# Patient Record
Sex: Male | Born: 1955 | Race: White | Hispanic: No | Smoking: Former smoker
Health system: Southern US, Community
[De-identification: ages and names within clinical notes are randomized; demographics above are authoritative.]

## PROBLEM LIST (undated history)

## (undated) DIAGNOSIS — M1990 Unspecified osteoarthritis, unspecified site: Secondary | ICD-10-CM

## (undated) DIAGNOSIS — F419 Anxiety disorder, unspecified: Secondary | ICD-10-CM

## (undated) DIAGNOSIS — R011 Cardiac murmur, unspecified: Secondary | ICD-10-CM

## (undated) DIAGNOSIS — I2510 Atherosclerotic heart disease of native coronary artery without angina pectoris: Secondary | ICD-10-CM

## (undated) DIAGNOSIS — I350 Nonrheumatic aortic (valve) stenosis: Secondary | ICD-10-CM

## (undated) DIAGNOSIS — F329 Major depressive disorder, single episode, unspecified: Secondary | ICD-10-CM

## (undated) DIAGNOSIS — F32A Depression, unspecified: Secondary | ICD-10-CM

## (undated) DIAGNOSIS — C801 Malignant (primary) neoplasm, unspecified: Secondary | ICD-10-CM

## (undated) HISTORY — DX: Atherosclerotic heart disease of native coronary artery without angina pectoris: I25.10

## (undated) HISTORY — PX: KNEE ARTHROSCOPY: SHX127

## (undated) HISTORY — PX: LEG SURGERY: SHX1003

## (undated) HISTORY — PX: CARDIOVASCULAR STRESS TEST: SHX262

---

## 2016-02-03 ENCOUNTER — Ambulatory Visit (INDEPENDENT_AMBULATORY_CARE_PROVIDER_SITE_OTHER): Payer: Medicare Other

## 2016-02-03 ENCOUNTER — Encounter (INDEPENDENT_AMBULATORY_CARE_PROVIDER_SITE_OTHER): Payer: Self-pay | Admitting: Orthopaedic Surgery

## 2016-02-03 ENCOUNTER — Ambulatory Visit (INDEPENDENT_AMBULATORY_CARE_PROVIDER_SITE_OTHER): Payer: Medicare Other | Admitting: Orthopaedic Surgery

## 2016-02-03 VITALS — BP 135/82 | HR 73 | Resp 14 | Ht 68.5 in | Wt 168.0 lb

## 2016-02-03 DIAGNOSIS — M5442 Lumbago with sciatica, left side: Secondary | ICD-10-CM

## 2016-02-03 DIAGNOSIS — M25552 Pain in left hip: Secondary | ICD-10-CM

## 2016-02-03 DIAGNOSIS — G8929 Other chronic pain: Secondary | ICD-10-CM
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
## 2016-04-20 ENCOUNTER — Ambulatory Visit (INDEPENDENT_AMBULATORY_CARE_PROVIDER_SITE_OTHER): Payer: Medicare Other | Admitting: Orthopedic Surgery

## 2016-04-20 ENCOUNTER — Encounter (HOSPITAL_COMMUNITY): Payer: Self-pay

## 2016-04-20 ENCOUNTER — Encounter (HOSPITAL_COMMUNITY)
Admission: RE | Admit: 2016-04-20 | Discharge: 2016-04-20 | Disposition: A | Discharge status: Home / Self Care | Payer: Medicare Other | Source: Ambulatory Visit | Attending: Orthopaedic Surgery | Admitting: Orthopaedic Surgery

## 2016-04-20 ENCOUNTER — Ambulatory Visit (HOSPITAL_COMMUNITY)
Admission: RE | Admit: 2016-04-20 | Discharge: 2016-04-20 | Disposition: A | Discharge status: Home / Self Care | Payer: Medicare Other | Source: Ambulatory Visit | Attending: Orthopedic Surgery | Admitting: Orthopedic Surgery

## 2016-04-20 ENCOUNTER — Encounter (INDEPENDENT_AMBULATORY_CARE_PROVIDER_SITE_OTHER): Payer: Self-pay | Admitting: Orthopedic Surgery

## 2016-04-20 VITALS — BP 139/81 | HR 77 | Ht 68.5 in | Wt 168.0 lb

## 2016-04-20 DIAGNOSIS — Z01812 Encounter for preprocedural laboratory examination: Secondary | ICD-10-CM | POA: Diagnosis not present

## 2016-04-20 DIAGNOSIS — M1612 Unilateral primary osteoarthritis, left hip: Secondary | ICD-10-CM | POA: Diagnosis not present

## 2016-04-20 DIAGNOSIS — M25552 Pain in left hip: Secondary | ICD-10-CM

## 2016-04-20 DIAGNOSIS — R9431 Abnormal electrocardiogram [ECG] [EKG]: Secondary | ICD-10-CM | POA: Insufficient documentation

## 2016-04-20 DIAGNOSIS — Z01810 Encounter for preprocedural cardiovascular examination: Secondary | ICD-10-CM | POA: Insufficient documentation

## 2016-04-20 DIAGNOSIS — Z01818 Encounter for other preprocedural examination: Secondary | ICD-10-CM

## 2016-04-20 HISTORY — DX: Major depressive disorder, single episode, unspecified: F32.9

## 2016-04-20 HISTORY — DX: Depression, unspecified: F32.A

## 2016-04-20 HISTORY — DX: Cardiac murmur, unspecified: R01.1

## 2016-04-20 HISTORY — DX: Malignant (primary) neoplasm, unspecified: C80.1

## 2016-04-20 HISTORY — DX: Nonrheumatic aortic (valve) stenosis: I35.0

## 2016-04-20 HISTORY — DX: Anxiety disorder, unspecified: F41.9

## 2016-04-20 HISTORY — DX: Unspecified osteoarthritis, unspecified site: M19.90

## 2016-04-20 LAB — COMPREHENSIVE METABOLIC PANEL
ALT: 21 U/L (ref 17–63)
AST: 26 U/L (ref 15–41)
Albumin: 4.3 g/dL (ref 3.5–5.0)
Alkaline Phosphatase: 75 U/L (ref 38–126)
Anion gap: 13 (ref 5–15)
BILIRUBIN TOTAL: 0.8 mg/dL (ref 0.3–1.2)
BUN: 15 mg/dL (ref 6–20)
CHLORIDE: 98 mmol/L — AB (ref 101–111)
CO2: 23 mmol/L (ref 22–32)
CREATININE: 1.38 mg/dL — AB (ref 0.61–1.24)
Calcium: 9.4 mg/dL (ref 8.9–10.3)
GFR calc Af Amer: >60 mL/min (ref >60)
GFR, EST NON AFRICAN AMERICAN: 54 mL/min — AB (ref >60)
Glucose, Bld: 97 mg/dL (ref 65–99)
Potassium: 4.4 mmol/L (ref 3.5–5.1)
Sodium: 134 mmol/L — ABNORMAL LOW (ref 135–145)
Total Protein: 7.7 g/dL (ref 6.5–8.1)

## 2016-04-20 LAB — CBC WITH DIFFERENTIAL/PLATELET
BASOS ABS: 0.1 10*3/uL (ref 0.0–0.1)
Band Neutrophils: 0 %
Basophils Relative: 1 %
Blasts: 0 %
Eosinophils Absolute: 0.6 10*3/uL (ref 0.0–0.7)
Eosinophils Relative: 4 %
HCT: 45.8 % (ref 39.0–52.0)
Hemoglobin: 15.6 g/dL (ref 13.0–17.0)
Lymphocytes Relative: 34 %
Lymphs Abs: 4.8 10*3/uL — ABNORMAL HIGH (ref 0.7–4.0)
MCH: 30.2 pg (ref 26.0–34.0)
MCHC: 34.1 g/dL (ref 30.0–36.0)
MCV: 88.6 fL (ref 78.0–100.0)
METAMYELOCYTES PCT: 0 %
MYELOCYTES: 0 %
Monocytes Absolute: 0.7 10*3/uL (ref 0.1–1.0)
Monocytes Relative: 5 %
Neutro Abs: 7.8 10*3/uL — ABNORMAL HIGH (ref 1.7–7.7)
Neutrophils Relative %: 56 %
Other: 0 %
PLATELETS: 235 10*3/uL (ref 150–400)
PROMYELOCYTES ABS: 0 %
RBC: 5.17 MIL/uL (ref 4.22–5.81)
RDW: 13.9 % (ref 11.5–15.5)
WBC: 14 10*3/uL — AB (ref 4.0–10.5)
nRBC: 0 /100 WBC

## 2016-04-20 LAB — SURGICAL PCR SCREEN
MRSA, PCR: NEGATIVE
Staphylococcus aureus: NEGATIVE

## 2016-04-20 LAB — PROTIME-INR
INR: 1.04
PROTHROMBIN TIME: 13.6 s (ref 11.4–15.2)

## 2016-04-20 LAB — ABO/RH: ABO/RH(D): A NEG

## 2016-04-20 LAB — APTT: APTT: 28 s (ref 24–36)

## 2016-04-20 MED ORDER — HYDROCODONE-ACETAMINOPHEN 10-325 MG PO TABS: 1.0000 | ORAL_TABLET | ORAL | 0 refills | Status: DC | PRN

## 2016-04-20 NOTE — Progress Notes (Deleted)
   Office Visit Note   Patient: Carl Hunt           Date of Birth: 1955/06/08           MRN: [id number] Visit Date: 04/20/2016              Requested by: No referring provider defined for this encounter. PCP: Pcp Not In System   Assessment & Plan: Visit Diagnoses:  1. Pain of left hip joint     Plan: ***  Follow-Up Instructions: No Follow-up on file.   Orders:  No orders of the defined types were placed in this encounter.  No orders of the defined types were placed in this encounter.     Procedures: No procedures performed   Clinical Data: No additional findings.   Subjective: No chief complaint on file.   Pt here today for H & P fofr Left Total hip replacement on 05/04/16.    Review of Systems  Constitutional: Negative.   HENT: Negative.   Eyes: Negative.   Respiratory: Negative.   Cardiovascular: Negative.   Gastrointestinal: Negative.   Endocrine: Negative.   Genitourinary: Negative.   Musculoskeletal: Negative.   Skin: Negative.   Allergic/Immunologic: Negative.   Neurological: Negative.   Hematological: Negative.   Psychiatric/Behavioral: Negative.      Objective: Vital Signs: There were no vitals taken for this visit.  Physical Exam  Ortho Exam  Specialty Comments:  No specialty comments available.  Imaging: No results found.   PMFS History: There are no active problems to display for this patient.  No past medical history on file.  No family history on file.  No past surgical history on file. Social History   Occupational History  . Not on file.   Social History Main Topics  . Smoking status: Light Tobacco Smoker    Types: Cigarettes  . Smokeless tobacco: Never Used  . Alcohol use 0.6 oz/week    1 Cans of beer per week  . Drug use: Unknown  . Sexual activity: Not on file

## 2016-04-20 NOTE — Progress Notes (Signed)
REQUESTED STRESS TEST, ECHO, EKG, OV, FROM [facility].  [phone]

## 2016-04-20 NOTE — Pre-Procedure Instructions (Signed)
Carl Hunt  04/20/2016      [place], [place] - [address] [place] [address] Phone: [phone] Fax: [phone]    Your procedure is scheduled on   Tuesday  05/03/16  Report to [facility] Admitting at 530 A.M.  Call this number if you have problems the morning of surgery:  [phone]   Remember:  Do not eat food or drink liquids after midnight.  Take these medicines the morning of surgery with A SIP OF WATER   ALPRAZOLAM (XANAX), HYDROCODONE IF NEEDED  (STOP 7 DAYS PRIOR TO SURGERY ASPIRIN OR ASPIRIN PRODUCTS, IBUPROFEN/ ADVIL/ MOTRIN, GOODY POWDERS, BC'S, DICLOFENAC/ VOLTAREN, OMEGA 3 FISH OIL, HERBAL MEDICINES)   Do not wear jewelry, make-up or nail polish.  Do not wear lotions, powders, or perfumes, or deoderant.  Do not shave 48 hours prior to surgery.  Men may shave face and neck.  Do not bring valuables to the hospital.  [facility] is not responsible for any belongings or valuables.  Contacts, dentures or bridgework may not be worn into surgery.  Leave your suitcase in the car.  After surgery it may be brought to your room.  For patients admitted to the hospital, discharge time will be determined by your treatment team.  Patients discharged the day of surgery will not be allowed to drive home.   Name and phone number of your driver:     Special instructions:  [name] - Preparing for Surgery  Before surgery, you can play an important role.  Because skin is not sterile, your skin needs to be as free of germs as possible.  You can reduce the number of germs on you skin by washing with CHG (chlorahexidine gluconate) soap before surgery.  CHG is an antiseptic cleaner which kills germs and bonds with the skin to continue killing germs even after washing.  Please DO NOT use if you have an allergy to CHG or antibacterial soaps.  If your skin becomes reddened/irritated stop using the CHG  and inform your nurse when you arrive at Short Stay.  Do not shave (including legs and underarms) for at least 48 hours prior to the first CHG shower.  You may shave your face.  Please follow these instructions carefully:   1.  Shower with CHG Soap the night before surgery and the                                morning of Surgery.  2.  If you choose to wash your hair, wash your hair first as usual with your       normal shampoo.  3.  After you shampoo, rinse your hair and body thoroughly to remove the                      Shampoo.  4.  Use CHG as you would any other liquid soap.  You can apply chg directly       to the skin and wash gently with scrungie or a clean washcloth.  5.  Apply the CHG Soap to your body ONLY FROM THE NECK DOWN.        Do not use on open wounds or open sores.  Avoid contact with your eyes,       ears, mouth and genitals (private parts).  Wash genitals (private parts)  with your normal soap.  6.  Wash thoroughly, paying special attention to the area where your surgery        will be performed.  7.  Thoroughly rinse your body with warm water from the neck down.  8.  DO NOT shower/wash with your normal soap after using and rinsing off       the CHG Soap.  9.  Pat yourself dry with a clean towel.            10.  Wear clean pajamas.            11.  Place clean sheets on your bed the night of your first shower and do not        sleep with pets.  Day of Surgery  Do not apply any lotions/deoderants the morning of surgery.  Please wear clean clothes to the hospital/surgery center.    Please read over the following fact sheets that you were given. Total Joint Packet, MRSA Information and Surgical Site Infection Prevention
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
## 2016-04-21 LAB — TYPE AND SCREEN
ABO/RH(D): A NEG
Antibody Screen: NEGATIVE

## 2016-04-21 LAB — URINE CULTURE
Culture: <10000 — AB
Special Requests: NORMAL

## 2016-04-22 ENCOUNTER — Encounter (HOSPITAL_COMMUNITY): Payer: Self-pay | Admitting: Vascular Surgery

## 2016-04-22 NOTE — Progress Notes (Addendum)
Anesthesia Chart Review: Patient is a 61 year old male scheduled for left THA on 05/03/16 by Dr. Durward Fortes.   History includes smoking, murmur, anxiety, depression, arthritis, skin cancer.   PCP is not listed. Cardiologist is Dr. Orpah Greek in [place], [place].  Meds include Xanax, Norco, magnesium, fish oil, Crestor.  BP 118/73   Pulse 69   Temp 37.1 C   Resp 20   Ht 5\' 8"  (1.727 m)   Wt 168 lb 3.4 oz (76.3 kg)   SpO2 99%   BMI 25.58 kg/m    EKG 04/20/16: NSR, possible inferior infarct (age undetermined), ST/T wave abnormality, consider anterolateral ischemia. Currently, there is no comparison tracing available. He reported stress and echo done in 2017.   Preoperative labs noted. WBC 14.0. Cr 1.38. Glucose 97. PT/PTT WNL. Voice message left with Taren at Dr. Rudene Anda office regarding WBC, Cr results. Urine culture showed less than 10,000 colonies per mL, insignificant growth.  Office notes, stress, echo, and EKG requested from Dr. Sabra Heck with [facility] [phone]), but are still pending. Office said their medical records staff should be back on 04/25/16.  Chart will be left for follow-up.  George Hugh [facility] Short Stay Center/Anesthesiology Phone [phone] 04/22/2016 1:59 PM  Addendum: Cardiology records were faxed on 04/20/16. When I called on 04/22/16, staff said records would be faxed on 04/25/16. I called again on 04/26/16 and was told records would be faxed within 24 hours. I called again today, 04/28/16, and was told they would try to fax records before noon (but still not received after 5 PM). I updated Taren at Dr. Rudene Anda office on 04/27/16 that I was having difficultly getting [facility] to send me records. To my understanding Dr. Durward Fortes has received clearance from patient's pain physician Dr. Damita Dunnings in [place], but does not have any cardiology records available on this patient. Will continue to wait for cardiology  records.  George [facility] Short Stay Center/Anesthesiology Phone [phone] 04/28/2016 5:13 PM  Addendum: I called again for cardiology records this morning. Records have just been received from [facility]. Patient was seen on 1/23/17by Dr. Orpah Greek for evaluation of abnormal EKG (lateral T wave inversions) done at his PCP office. 3/6 systolic murmur was also documented. Stress test showed no evidence of significant reversible ischemia, and medical therapy recommended. Echo showed moderate-severe AS. From records received, it does not appear that patient went back for follow-up--and he verbalized to PAT RN that his last echo was 03/2015, so I do not think he has had a follow-up echo either.   Nuclear stress test 04/16/15: Impression: 1. Normal left ventricular contractility. EF calculated at 51%. 2. No evidence of significant reversible ischemia. 3. Low risk scan. Plan: Medical treatment.  Echo 04/09/15: Impression: 1. LA, LV and RV are all dilated. 2. Mild AI, moderate to severe AS, flow 526.8 cm/sec, max PG 111, mean 69.1, AVA 0.61 cm sq. 3. Mild MR. 4. Mild TR. 5. Grade I diastolic dysfunction.  I did not receive a comparison EKG, only a description the tracing showed inversions in leads V5 and V6 which would be consistent with our EKG (although it also showed inversions in V3-4).   Unfortunately, cardiology records not received until today despite multiple requests. Office said they faxed on 04/20/16 when requested, but we never received plus I called every other work day since then requesting those records. Reviewed with anesthesiologist Dr. Jenita Seashore who agrees that patient will need  repeat echo and cardiology evaluation prior to surgery. [place] notified.  George Hugh [facility] Short Stay Center/Anesthesiology Phone [phone] 05/02/2016 1:45 PM
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
## 2016-05-02 ENCOUNTER — Encounter (HOSPITAL_COMMUNITY): Payer: Self-pay

## 2016-05-02 ENCOUNTER — Telehealth (INDEPENDENT_AMBULATORY_CARE_PROVIDER_SITE_OTHER): Payer: Self-pay | Admitting: Orthopaedic Surgery

## 2016-05-02 MED ORDER — TRANEXAMIC ACID 1000 MG/10ML IV SOLN
2000.0000 mg | INTRAVENOUS | Status: DC
  Filled 2016-05-02: qty 20

## 2016-05-02 NOTE — Telephone Encounter (Signed)
Pt called and stated Dr. Sanjuan Dame nurse said she didn't see any reason pt could not have surgery. But he is having a echocardiogram done on Friday. He stated that the nurse told him that when Dr. Sabra Heck is in the office tomorrow he may say pt doesn't need this echo to be completed and will sign off on clearance form. Pt also requesting refill of pain meds.

## 2016-05-02 NOTE — Telephone Encounter (Signed)
Please advise 

## 2016-05-03 ENCOUNTER — Telehealth (INDEPENDENT_AMBULATORY_CARE_PROVIDER_SITE_OTHER): Payer: Self-pay | Admitting: Orthopaedic Surgery

## 2016-05-03 ENCOUNTER — Inpatient Hospital Stay (HOSPITAL_COMMUNITY): Admission: RE | Admit: 2016-05-03 | Payer: Medicare Other | Source: Ambulatory Visit | Admitting: Orthopaedic Surgery

## 2016-05-03 ENCOUNTER — Encounter (HOSPITAL_COMMUNITY): Admission: RE | Payer: Self-pay | Source: Ambulatory Visit

## 2016-05-03 SURGERY — ARTHROPLASTY, HIP, TOTAL,POSTERIOR APPROACH
Anesthesia: General | Site: Hip | Laterality: Left

## 2016-05-03 NOTE — Telephone Encounter (Signed)
Called pt and discussed that cardiology, Dr. Sanjuan Dame office has scheduled pt for a echo this week and a stress test next week. Pt is upset surgery has been cancelled and that he needs these clearances completed. Advised pt I will call to reschedule surgery as soon as I receive clearance from cardiology.

## 2016-05-03 NOTE — Telephone Encounter (Signed)
Patient is trying to get in touch with [facility] about his surgery with Dr. Durward Fortes. He states he is going to the cardiologist on Friday for an echo and then going back Tuesday for a stress test. He says they will have the results on Wednesday. He is requesting a call back from [place] and would also like to know when he could possibly have the surgery.

## 2016-05-03 NOTE — Telephone Encounter (Signed)
thanks

## 2016-05-11 ENCOUNTER — Telehealth (INDEPENDENT_AMBULATORY_CARE_PROVIDER_SITE_OTHER): Payer: Self-pay

## 2016-05-11 NOTE — Telephone Encounter (Signed)
Returned phone call from pt at [facility] office. Pt has asked for pain meds as PW

## 2016-05-11 NOTE — Telephone Encounter (Signed)
Dr. Durward Fortes called Dr. Damita Dunnings at pts pain management clinic in [place] and release Carl Hunt from his care. PO will not write any more rx for pain. He will undergo a heart cath next week, according to pt.

## 2016-05-18 ENCOUNTER — Inpatient Hospital Stay (INDEPENDENT_AMBULATORY_CARE_PROVIDER_SITE_OTHER): Payer: Medicare Other | Admitting: Orthopaedic Surgery

## 2016-05-25 ENCOUNTER — Telehealth (INDEPENDENT_AMBULATORY_CARE_PROVIDER_SITE_OTHER): Payer: Self-pay | Admitting: Orthopaedic Surgery

## 2016-05-25 HISTORY — PX: CARDIAC CATHETERIZATION: SHX172

## 2016-05-25 NOTE — Telephone Encounter (Signed)
Dr. Thayer Ohm office called and states they are getting patient in for a consult next week and the patient will probably have surgery in April to replace his aortic valve. Just an fyi since he was scheduled for surgery with Dr. Durward Fortes and failed the cardiac clearance.

## 2016-06-02 ENCOUNTER — Institutional Professional Consult (permissible substitution) (INDEPENDENT_AMBULATORY_CARE_PROVIDER_SITE_OTHER): Payer: Medicare Other | Admitting: Cardiothoracic Surgery

## 2016-06-02 VITALS — BP 113/73 | Resp 16 | Ht 68.0 in | Wt 164.0 lb

## 2016-06-02 DIAGNOSIS — G894 Chronic pain syndrome: Secondary | ICD-10-CM | POA: Diagnosis not present

## 2016-06-02 DIAGNOSIS — M1612 Unilateral primary osteoarthritis, left hip: Secondary | ICD-10-CM

## 2016-06-02 DIAGNOSIS — I350 Nonrheumatic aortic (valve) stenosis: Secondary | ICD-10-CM | POA: Diagnosis not present
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
## 2016-06-06 ENCOUNTER — Other Ambulatory Visit: Payer: Self-pay | Admitting: *Deleted

## 2016-06-06 DIAGNOSIS — I350 Nonrheumatic aortic (valve) stenosis: Secondary | ICD-10-CM

## 2016-06-06 DIAGNOSIS — Z01818 Encounter for other preprocedural examination: Secondary | ICD-10-CM

## 2016-06-06 DIAGNOSIS — I712 Thoracic aortic aneurysm, without rupture, unspecified: Secondary | ICD-10-CM

## 2016-06-08 ENCOUNTER — Ambulatory Visit (HOSPITAL_COMMUNITY): Payer: Medicare Other

## 2016-06-08 ENCOUNTER — Ambulatory Visit
Admission: RE | Admit: 2016-06-08 | Discharge: 2016-06-08 | Disposition: A | Discharge status: Home / Self Care | Payer: Medicare Other | Source: Ambulatory Visit | Attending: Cardiothoracic Surgery | Admitting: Cardiothoracic Surgery

## 2016-06-08 DIAGNOSIS — Z01818 Encounter for other preprocedural examination: Secondary | ICD-10-CM

## 2016-06-08 DIAGNOSIS — I712 Thoracic aortic aneurysm, without rupture, unspecified: Secondary | ICD-10-CM

## 2016-06-21 ENCOUNTER — Encounter (HOSPITAL_COMMUNITY)
Admission: RE | Admit: 2016-06-21 | Discharge: 2016-06-21 | Disposition: A | Discharge status: Home / Self Care | Payer: Medicare Other | Source: Ambulatory Visit | Attending: Cardiothoracic Surgery | Admitting: Cardiothoracic Surgery

## 2016-06-21 ENCOUNTER — Encounter (HOSPITAL_COMMUNITY): Payer: Self-pay

## 2016-06-21 ENCOUNTER — Ambulatory Visit (HOSPITAL_COMMUNITY)
Admission: RE | Admit: 2016-06-21 | Discharge: 2016-06-21 | Disposition: A | Discharge status: Home / Self Care | Payer: Medicare Other | Source: Ambulatory Visit | Attending: Cardiothoracic Surgery | Admitting: Cardiothoracic Surgery

## 2016-06-21 ENCOUNTER — Ambulatory Visit (HOSPITAL_BASED_OUTPATIENT_CLINIC_OR_DEPARTMENT_OTHER)
Admission: RE | Admit: 2016-06-21 | Discharge: 2016-06-21 | Disposition: A | Discharge status: Home / Self Care | Payer: Medicare Other | Source: Ambulatory Visit | Attending: Cardiothoracic Surgery | Admitting: Cardiothoracic Surgery

## 2016-06-21 DIAGNOSIS — I350 Nonrheumatic aortic (valve) stenosis: Secondary | ICD-10-CM | POA: Diagnosis not present

## 2016-06-21 DIAGNOSIS — I700 Atherosclerosis of aorta: Secondary | ICD-10-CM | POA: Insufficient documentation

## 2016-06-21 DIAGNOSIS — J449 Chronic obstructive pulmonary disease, unspecified: Secondary | ICD-10-CM

## 2016-06-21 DIAGNOSIS — I6523 Occlusion and stenosis of bilateral carotid arteries: Secondary | ICD-10-CM | POA: Insufficient documentation

## 2016-06-21 LAB — PULMONARY FUNCTION TEST
DL/VA % pred: 63 %
DL/VA: 2.88 ml/min/mmHg/L
DLCO unc % pred: 50 %
DLCO unc: 15.42 ml/min/mmHg
FEF 25-75 Post: 2.56 L/s
FEF 25-75 Pre: 1.52 L/s
FEF2575-%Change-Post: 68 %
FEF2575-%Pred-Post: 89 %
FEF2575-%Pred-Pre: 52 %
FEV1-%Change-Post: 30 %
FEV1-%Pred-Post: 76 %
FEV1-%Pred-Pre: 58 %
FEV1-Post: 2.64 L
FEV1-Pre: 2.02 L
FEV1FVC-%Change-Post: 32 %
FEV1FVC-%Pred-Pre: 78 %
FEV6-%Change-Post: 1 %
FEV6-%Pred-Post: 76 %
FEV6-%Pred-Pre: 75 %
FEV6-Post: 3.33 L
FEV6-Pre: 3.28 L
FEV6FVC-%Change-Post: 0 %
FEV6FVC-%Pred-Post: 105 %
FEV6FVC-%Pred-Pre: 104 %
FVC-%Change-Post: -1 %
FVC-%Pred-Post: 73 %
FVC-%Pred-Pre: 74 %
FVC-Post: 3.33 L
FVC-Pre: 3.37 L
Post FEV1/FVC ratio: 79 %
Post FEV6/FVC ratio: 100 %
Pre FEV1/FVC ratio: 60 %
Pre FEV6/FVC Ratio: 99 %
RV % pred: 244 %
RV: 5.26 L
TLC % pred: 131 %
TLC: 8.77 L

## 2016-06-21 LAB — COMPREHENSIVE METABOLIC PANEL WITH GFR
ALT: 20 U/L (ref 17–63)
AST: 25 U/L (ref 15–41)
Albumin: 3.2 g/dL — ABNORMAL LOW (ref 3.5–5.0)
Alkaline Phosphatase: 67 U/L (ref 38–126)
Anion gap: 11 (ref 5–15)
BUN: 12 mg/dL (ref 6–20)
CO2: 24 mmol/L (ref 22–32)
Calcium: 8.9 mg/dL (ref 8.9–10.3)
Chloride: 101 mmol/L (ref 101–111)
Creatinine, Ser: 1.29 mg/dL — ABNORMAL HIGH (ref 0.61–1.24)
GFR calc Af Amer: >60 mL/min
GFR calc non Af Amer: 59 mL/min — ABNORMAL LOW
Glucose, Bld: 124 mg/dL — ABNORMAL HIGH (ref 65–99)
Potassium: 3.8 mmol/L (ref 3.5–5.1)
Sodium: 136 mmol/L (ref 135–145)
Total Bilirubin: 0.2 mg/dL — ABNORMAL LOW (ref 0.3–1.2)
Total Protein: 6.3 g/dL — ABNORMAL LOW (ref 6.5–8.1)

## 2016-06-21 LAB — SURGICAL PCR SCREEN
MRSA, PCR: NEGATIVE
Staphylococcus aureus: NEGATIVE

## 2016-06-21 LAB — URINALYSIS, ROUTINE W REFLEX MICROSCOPIC
Bilirubin Urine: NEGATIVE
Glucose, UA: NEGATIVE mg/dL
Hgb urine dipstick: NEGATIVE
Ketones, ur: NEGATIVE mg/dL
Leukocytes, UA: NEGATIVE
Nitrite: NEGATIVE
Protein, ur: NEGATIVE mg/dL
Specific Gravity, Urine: 1.009 (ref 1.005–1.030)
pH: 6 (ref 5.0–8.0)

## 2016-06-21 LAB — CBC
HCT: 33.9 % — ABNORMAL LOW (ref 39.0–52.0)
Hemoglobin: 11.4 g/dL — ABNORMAL LOW (ref 13.0–17.0)
MCH: 29.3 pg (ref 26.0–34.0)
MCHC: 33.6 g/dL (ref 30.0–36.0)
MCV: 87.1 fL (ref 78.0–100.0)
Platelets: 221 10*3/uL (ref 150–400)
RBC: 3.89 MIL/uL — ABNORMAL LOW (ref 4.22–5.81)
RDW: 14.2 % (ref 11.5–15.5)
WBC: 7.3 10*3/uL (ref 4.0–10.5)

## 2016-06-21 LAB — BLOOD GAS, ARTERIAL
Acid-Base Excess: 0.5 mmol/L (ref 0.0–2.0)
Bicarbonate: 25.1 mmol/L (ref 20.0–28.0)
Drawn by: 449841
FIO2: 21
O2 Saturation: 95.2 %
Patient temperature: 98.6
pCO2 arterial: 44.5 mmHg (ref 32.0–48.0)
pH, Arterial: 7.37 (ref 7.350–7.450)
pO2, Arterial: 73.7 mmHg — ABNORMAL LOW (ref 83.0–108.0)

## 2016-06-21 LAB — APTT: aPTT: 29 seconds (ref 24–36)

## 2016-06-21 LAB — VAS US DOPPLER PRE CABG
LEFT ECA DIAS: -18 cm/s
LEFT VERTEBRAL DIAS: -14 cm/s
Left CCA dist dias: -21 cm/s
Left CCA dist sys: -89 cm/s
Left CCA prox dias: 25 cm/s
Left CCA prox sys: 85 cm/s
Left ICA dist dias: -43 cm/s
Left ICA dist sys: -91 cm/s
Left ICA prox dias: -41 cm/s
Left ICA prox sys: -98 cm/s
RIGHT ECA DIAS: -19 cm/s
RIGHT VERTEBRAL DIAS: -11 cm/s
Right CCA prox dias: 19 cm/s
Right CCA prox sys: 99 cm/s
Right cca dist sys: -67 cm/s

## 2016-06-21 LAB — PROTIME-INR
INR: 1.03
Prothrombin Time: 13.5 seconds (ref 11.4–15.2)

## 2016-06-21 MED ORDER — ALBUTEROL SULFATE (2.5 MG/3ML) 0.083% IN NEBU
2.5000 mg | INHALATION_SOLUTION | Freq: Once | RESPIRATORY_TRACT | Status: AC
  Administered 2016-06-21: 2.5 mg via RESPIRATORY_TRACT

## 2016-06-21 NOTE — Pre-Procedure Instructions (Signed)
Carl Hunt  06/21/2016      [place], [place] - [address] [place] [address] Phone: [phone] Fax: [phone]    Your procedure is scheduled on April 19  Report to [facility] at [place].M.  Call this number if you have problems the morning of surgery:  [phone]   Remember:  Do not eat food or drink liquids after midnight.   Take these medicines the morning of surgery with A SIP OF WATER alprazolam Duanne Moron),  HYDROcodone-acetaminophen (NORCO),   Take all other medications as prescribed except 7 days prior to surgery STOP taking any Aspirin, Aleve, Naproxen, Ibuprofen, Motrin, Advil, Goody's, BC's, all herbal medications, fish oil, and all vitamins    Do not wear jewelry.  Do not wear lotions, powders, or cologne, or deoderant.  Men may shave face and neck.  Do not bring valuables to the hospital.  [facility] is not responsible for any belongings or valuables.  Contacts, dentures or bridgework may not be worn into surgery.  Leave your suitcase in the car.  After surgery it may be brought to your room.  For patients admitted to the hospital, discharge time will be determined by your treatment team.  Patients discharged the day of surgery will not be allowed to drive home.    Special instructions:   [facility]- Preparing For Surgery  Before surgery, you can play an important role. Because skin is not sterile, your skin needs to be as free of germs as possible. You can reduce the number of germs on your skin by washing with CHG (chlorahexidine gluconate) Soap before surgery.  CHG is an antiseptic cleaner which kills germs and bonds with the skin to continue killing germs even after washing.  Please do not use if you have an allergy to CHG or antibacterial soaps. If your skin becomes reddened/irritated stop using the CHG.  Do not shave (including legs and underarms) for at  least 48 hours prior to first CHG shower. It is OK to shave your face.  Please follow these instructions carefully.   1. Shower the NIGHT BEFORE SURGERY and the MORNING OF SURGERY with CHG.   2. If you chose to wash your hair, wash your hair first as usual with your normal shampoo.  3. After you shampoo, rinse your hair and body thoroughly to remove the shampoo.  4. Use CHG as you would any other liquid soap. You can apply CHG directly to the skin and wash gently with a scrungie or a clean washcloth.   5. Apply the CHG Soap to your body ONLY FROM THE NECK DOWN.  Do not use on open wounds or open sores. Avoid contact with your eyes, ears, mouth and genitals (private parts). Wash genitals (private parts) with your normal soap.  6. Wash thoroughly, paying special attention to the area where your surgery will be performed.  7. Thoroughly rinse your body with warm water from the neck down.  8. DO NOT shower/wash with your normal soap after using and rinsing off the CHG Soap.  9. Pat yourself dry with a CLEAN TOWEL.   10. Wear CLEAN PAJAMAS   11. Place CLEAN SHEETS on your bed the night of your first shower and DO NOT SLEEP WITH PETS.    Day of Surgery: Do not apply any deodorants/lotions. Please wear clean clothes to the hospital/surgery center.      Please read over  the following fact sheets that you were given.

## 2016-06-21 NOTE — Progress Notes (Signed)
PCP - Adonis Housekeeper Cardiologist - Dominica Severin. Miller  Chest x-ray - 06/21/16 EKG - 06/07/16 Stress Test -05/10/16  ECHO - 06/13/16 Cardiac Cath -06/07/16   Sending to anesthesia for review of cardiac testing    Patient denies shortness of breath, fever, cough and chest pain at PAT appointment   Patient verbalized understanding of instructions that was given to them at the PAT appointment. Patient expressed that there were no further questions.  Patient was also instructed that they will need to review over the PAT instructions again at home before the surgery.

## 2016-06-21 NOTE — Progress Notes (Addendum)
VASCULAR LAB PRELIMINARY  PRELIMINARY  PRELIMINARY  PRELIMINARY  Pre-op Cardiac Surgery  Carotid Findings:  Bilateral - 1% to 39% ICA stenosis lower end of scale. Vertebral artery flow is antegrade.  Upper Extremity Right Left  Brachial Pressures 110 Triphasic 112 Triphasic  Radial Waveforms Triphasic Trihasic  Ulnar Waveforms Triphasic Triphasic  Palmar Arch (Allen's Test) Normal Abnormal   Findings:  Right - Doppler waveforms remained normal with both radial and ulnar compressions. Left - Doppler waveforms remained normal with radial compression and reversed with ulnar compression.   Chestina Komatsu, RVS 06/21/2016, 12:31 PM

## 2016-06-22 LAB — HEMOGLOBIN A1C
Hgb A1c MFr Bld: 5.8 % — ABNORMAL HIGH (ref 4.8–5.6)
Mean Plasma Glucose: 120 mg/dL

## 2016-06-22 MED ORDER — TRANEXAMIC ACID (OHS) BOLUS VIA INFUSION
15.0000 mg/kg | INTRAVENOUS | Status: AC
  Administered 2016-06-23: 1192.5 mg via INTRAVENOUS
  Filled 2016-06-22: qty 1193

## 2016-06-22 MED ORDER — VANCOMYCIN HCL 10 G IV SOLR
1250.0000 mg | INTRAVENOUS | Status: AC
  Administered 2016-06-23: 1250 mg via INTRAVENOUS
  Filled 2016-06-22: qty 1250

## 2016-06-22 MED ORDER — DEXTROSE 5 % IV SOLN
1.5000 g | INTRAVENOUS | Status: AC
  Administered 2016-06-23: .75 g via INTRAVENOUS
  Administered 2016-06-23: 1.5 g via INTRAVENOUS
  Filled 2016-06-22: qty 1.5

## 2016-06-22 MED ORDER — SODIUM CHLORIDE 0.9 % IV SOLN
30.0000 ug/min | INTRAVENOUS | Status: AC
  Administered 2016-06-23: 20 ug/min via INTRAVENOUS
  Filled 2016-06-22: qty 2

## 2016-06-22 MED ORDER — EPINEPHRINE PF 1 MG/ML IJ SOLN
0.0000 ug/min | INTRAVENOUS | Status: DC
  Filled 2016-06-22: qty 4

## 2016-06-22 MED ORDER — SODIUM CHLORIDE 0.9 % IV SOLN
INTRAVENOUS | Status: DC
  Filled 2016-06-22: qty 30

## 2016-06-22 MED ORDER — METOPROLOL TARTRATE 12.5 MG HALF TABLET
12.5000 mg | ORAL_TABLET | Freq: Once | ORAL | Status: AC
  Administered 2016-06-23: 12.5 mg via ORAL
  Filled 2016-06-22: qty 1

## 2016-06-22 MED ORDER — TRANEXAMIC ACID 1000 MG/10ML IV SOLN
1.5000 mg/kg/h | INTRAVENOUS | Status: AC
  Administered 2016-06-23: 1.5 mg/kg/h via INTRAVENOUS
  Filled 2016-06-22: qty 25

## 2016-06-22 MED ORDER — PLASMA-LYTE 148 IV SOLN
INTRAVENOUS | Status: DC
  Filled 2016-06-22: qty 2.5

## 2016-06-22 MED ORDER — CEFUROXIME SODIUM 750 MG IJ SOLR
750.0000 mg | INTRAMUSCULAR | Status: DC
  Filled 2016-06-22: qty 750

## 2016-06-22 MED ORDER — MAGNESIUM SULFATE 50 % IJ SOLN
40.0000 meq | INTRAMUSCULAR | Status: DC
  Filled 2016-06-22: qty 10

## 2016-06-22 MED ORDER — SODIUM CHLORIDE 0.9 % IV SOLN
INTRAVENOUS | Status: AC
  Administered 2016-06-23: 1.2 [IU]/h via INTRAVENOUS
  Filled 2016-06-22: qty 2.5

## 2016-06-22 MED ORDER — DEXMEDETOMIDINE HCL IN NACL 400 MCG/100ML IV SOLN
0.1000 ug/kg/h | INTRAVENOUS | Status: AC
  Administered 2016-06-23: .3 ug/kg/h via INTRAVENOUS
  Filled 2016-06-22: qty 100

## 2016-06-22 MED ORDER — NITROGLYCERIN IN D5W 200-5 MCG/ML-% IV SOLN
2.0000 ug/min | INTRAVENOUS | Status: DC
  Filled 2016-06-22: qty 250

## 2016-06-22 MED ORDER — TRANEXAMIC ACID (OHS) PUMP PRIME SOLUTION
2.0000 mg/kg | INTRAVENOUS | Status: DC
  Filled 2016-06-22: qty 1.59

## 2016-06-22 MED ORDER — POTASSIUM CHLORIDE 2 MEQ/ML IV SOLN
80.0000 meq | INTRAVENOUS | Status: DC
  Filled 2016-06-22: qty 40

## 2016-06-22 MED ORDER — DOPAMINE-DEXTROSE 3.2-5 MG/ML-% IV SOLN
0.0000 ug/kg/min | INTRAVENOUS | Status: DC
  Filled 2016-06-22: qty 250
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
## 2016-06-23 ENCOUNTER — Inpatient Hospital Stay (HOSPITAL_COMMUNITY)
Admission: RE | Admit: 2016-06-23 | Discharge: 2016-06-28 | DRG: 220 | Disposition: A | Discharge status: Home / Self Care | Payer: Medicare Other | Source: Ambulatory Visit | Attending: Cardiothoracic Surgery | Admitting: Cardiothoracic Surgery

## 2016-06-23 ENCOUNTER — Inpatient Hospital Stay (HOSPITAL_COMMUNITY): Payer: Medicare Other | Admitting: Anesthesiology

## 2016-06-23 ENCOUNTER — Inpatient Hospital Stay (HOSPITAL_COMMUNITY): Payer: Medicare Other

## 2016-06-23 ENCOUNTER — Inpatient Hospital Stay (HOSPITAL_COMMUNITY): Payer: Medicare Other | Admitting: Vascular Surgery

## 2016-06-23 ENCOUNTER — Encounter (HOSPITAL_COMMUNITY)
Admission: RE | Disposition: A | Discharge status: Home / Self Care | Payer: Self-pay | Source: Ambulatory Visit | Attending: Cardiothoracic Surgery

## 2016-06-23 ENCOUNTER — Encounter (HOSPITAL_COMMUNITY): Payer: Self-pay | Admitting: *Deleted

## 2016-06-23 DIAGNOSIS — I2510 Atherosclerotic heart disease of native coronary artery without angina pectoris: Secondary | ICD-10-CM | POA: Diagnosis present

## 2016-06-23 DIAGNOSIS — D62 Acute posthemorrhagic anemia: Secondary | ICD-10-CM | POA: Diagnosis not present

## 2016-06-23 DIAGNOSIS — D689 Coagulation defect, unspecified: Secondary | ICD-10-CM | POA: Diagnosis not present

## 2016-06-23 DIAGNOSIS — M1612 Unilateral primary osteoarthritis, left hip: Secondary | ICD-10-CM | POA: Diagnosis present

## 2016-06-23 DIAGNOSIS — Z87891 Personal history of nicotine dependence: Secondary | ICD-10-CM

## 2016-06-23 DIAGNOSIS — Z79899 Other long term (current) drug therapy: Secondary | ICD-10-CM | POA: Diagnosis not present

## 2016-06-23 DIAGNOSIS — D696 Thrombocytopenia, unspecified: Secondary | ICD-10-CM | POA: Diagnosis present

## 2016-06-23 DIAGNOSIS — Z791 Long term (current) use of non-steroidal anti-inflammatories (NSAID): Secondary | ICD-10-CM

## 2016-06-23 DIAGNOSIS — Z85828 Personal history of other malignant neoplasm of skin: Secondary | ICD-10-CM

## 2016-06-23 DIAGNOSIS — F419 Anxiety disorder, unspecified: Secondary | ICD-10-CM | POA: Diagnosis present

## 2016-06-23 DIAGNOSIS — E8770 Fluid overload, unspecified: Secondary | ICD-10-CM | POA: Diagnosis present

## 2016-06-23 DIAGNOSIS — I350 Nonrheumatic aortic (valve) stenosis: Secondary | ICD-10-CM

## 2016-06-23 DIAGNOSIS — R58 Hemorrhage, not elsewhere classified: Secondary | ICD-10-CM

## 2016-06-23 DIAGNOSIS — I352 Nonrheumatic aortic (valve) stenosis with insufficiency: Secondary | ICD-10-CM | POA: Diagnosis present

## 2016-06-23 DIAGNOSIS — Z952 Presence of prosthetic heart valve: Secondary | ICD-10-CM

## 2016-06-23 HISTORY — PX: TEE WITHOUT CARDIOVERSION: SHX5443

## 2016-06-23 HISTORY — PX: AORTIC VALVE REPLACEMENT: SHX41

## 2016-06-23 LAB — DIC (DISSEMINATED INTRAVASCULAR COAGULATION)PANEL
D-Dimer, Quant: 0.44 ug/mL-FEU (ref 0.00–0.50)
Fibrinogen: 224 mg/dL (ref 210–475)
INR: 1.38
Platelets: 132 10*3/uL — ABNORMAL LOW (ref 150–400)
Prothrombin Time: 17.1 seconds — ABNORMAL HIGH (ref 11.4–15.2)
Smear Review: NONE SEEN
aPTT: 33 seconds (ref 24–36)

## 2016-06-23 LAB — POCT I-STAT 3, ART BLOOD GAS (G3+)
Acid-Base Excess: 2 mmol/L (ref 0.0–2.0)
Acid-Base Excess: 11 mmol/L — ABNORMAL HIGH (ref 0.0–2.0)
Acid-Base Excess: 2 mmol/L (ref 0.0–2.0)
Acid-Base Excess: 1 mmol/L (ref 0.0–2.0)
Bicarbonate: 28 mmol/L (ref 20.0–28.0)
Bicarbonate: 34.6 mmol/L — ABNORMAL HIGH (ref 20.0–28.0)
Bicarbonate: 26.4 mmol/L (ref 20.0–28.0)
Bicarbonate: 24.7 mmol/L (ref 20.0–28.0)
Bicarbonate: 23.7 mmol/L (ref 20.0–28.0)
O2 Saturation: 100 %
O2 Saturation: 100 %
O2 Saturation: 100 %
O2 Saturation: 94 %
O2 Saturation: 98 %
Patient temperature: 36.6
Patient temperature: 37.1
TCO2: 29 mmol/L (ref 0–100)
TCO2: 36 mmol/L (ref 0–100)
TCO2: 28 mmol/L (ref 0–100)
TCO2: 26 mmol/L (ref 0–100)
TCO2: 25 mmol/L (ref 0–100)
pCO2 arterial: 48.5 mmHg — ABNORMAL HIGH (ref 32.0–48.0)
pCO2 arterial: 40.8 mmHg (ref 32.0–48.0)
pCO2 arterial: 38.1 mmHg (ref 32.0–48.0)
pCO2 arterial: 35.8 mmHg (ref 32.0–48.0)
pCO2 arterial: 34.1 mmHg (ref 32.0–48.0)
pH, Arterial: 7.37 (ref 7.350–7.450)
pH, Arterial: 7.537 — ABNORMAL HIGH (ref 7.350–7.450)
pH, Arterial: 7.448 (ref 7.350–7.450)
pH, Arterial: 7.444 (ref 7.350–7.450)
pH, Arterial: 7.45 (ref 7.350–7.450)
pO2, Arterial: 412 mmHg — ABNORMAL HIGH (ref 83.0–108.0)
pO2, Arterial: 579 mmHg — ABNORMAL HIGH (ref 83.0–108.0)
pO2, Arterial: 246 mmHg — ABNORMAL HIGH (ref 83.0–108.0)
pO2, Arterial: 68 mmHg — ABNORMAL LOW (ref 83.0–108.0)
pO2, Arterial: 95 mmHg (ref 83.0–108.0)

## 2016-06-23 LAB — CBC
HCT: 25.3 % — ABNORMAL LOW (ref 39.0–52.0)
HEMATOCRIT: 23.5 % — AB (ref 39.0–52.0)
HEMOGLOBIN: 8.1 g/dL — AB (ref 13.0–17.0)
Hemoglobin: 8.8 g/dL — ABNORMAL LOW (ref 13.0–17.0)
MCH: 29.7 pg (ref 26.0–34.0)
MCH: 29.6 pg (ref 26.0–34.0)
MCHC: 34.5 g/dL (ref 30.0–36.0)
MCHC: 34.8 g/dL (ref 30.0–36.0)
MCV: 86.1 fL (ref 78.0–100.0)
MCV: 85.2 fL (ref 78.0–100.0)
Platelets: 121 10*3/uL — ABNORMAL LOW (ref 150–400)
Platelets: 122 10*3/uL — ABNORMAL LOW (ref 150–400)
RBC: 2.73 MIL/uL — ABNORMAL LOW (ref 4.22–5.81)
RBC: 2.97 MIL/uL — ABNORMAL LOW (ref 4.22–5.81)
RDW: 14.4 % (ref 11.5–15.5)
RDW: 16.4 % — ABNORMAL HIGH (ref 11.5–15.5)
WBC: 10.1 10*3/uL (ref 4.0–10.5)
WBC: 10.9 10*3/uL — ABNORMAL HIGH (ref 4.0–10.5)

## 2016-06-23 LAB — MAGNESIUM: Magnesium: 3.3 mg/dL — ABNORMAL HIGH (ref 1.7–2.4)

## 2016-06-23 LAB — POCT I-STAT 4, (NA,K, GLUC, HGB,HCT)
Glucose, Bld: 125 mg/dL — ABNORMAL HIGH (ref 65–99)
HCT: 24 % — ABNORMAL LOW (ref 39.0–52.0)
Hemoglobin: 8.2 g/dL — ABNORMAL LOW (ref 13.0–17.0)
Potassium: 3.9 mmol/L (ref 3.5–5.1)
Sodium: 139 mmol/L (ref 135–145)

## 2016-06-23 LAB — POCT I-STAT, CHEM 8
BUN: 10 mg/dL (ref 6–20)
BUN: 10 mg/dL (ref 6–20)
BUN: 8 mg/dL (ref 6–20)
BUN: 9 mg/dL (ref 6–20)
BUN: 10 mg/dL (ref 6–20)
BUN: 11 mg/dL (ref 6–20)
Calcium, Ion: 1.23 mmol/L (ref 1.15–1.40)
Calcium, Ion: 0.89 mmol/L — CL (ref 1.15–1.40)
Calcium, Ion: 1.22 mmol/L (ref 1.15–1.40)
Calcium, Ion: 1.06 mmol/L — ABNORMAL LOW (ref 1.15–1.40)
Calcium, Ion: 1.03 mmol/L — ABNORMAL LOW (ref 1.15–1.40)
Calcium, Ion: 1.08 mmol/L — ABNORMAL LOW (ref 1.15–1.40)
Chloride: 100 mmol/L — ABNORMAL LOW (ref 101–111)
Chloride: 101 mmol/L (ref 101–111)
Chloride: 98 mmol/L — ABNORMAL LOW (ref 101–111)
Chloride: 102 mmol/L (ref 101–111)
Chloride: 99 mmol/L — ABNORMAL LOW (ref 101–111)
Chloride: 104 mmol/L (ref 101–111)
Creatinine, Ser: 1 mg/dL (ref 0.61–1.24)
Creatinine, Ser: 0.6 mg/dL — ABNORMAL LOW (ref 0.61–1.24)
Creatinine, Ser: 1 mg/dL (ref 0.61–1.24)
Creatinine, Ser: 0.9 mg/dL (ref 0.61–1.24)
Creatinine, Ser: 1.1 mg/dL (ref 0.61–1.24)
Creatinine, Ser: 1.1 mg/dL (ref 0.61–1.24)
Glucose, Bld: 119 mg/dL — ABNORMAL HIGH (ref 65–99)
Glucose, Bld: 103 mg/dL — ABNORMAL HIGH (ref 65–99)
Glucose, Bld: 81 mg/dL (ref 65–99)
Glucose, Bld: 126 mg/dL — ABNORMAL HIGH (ref 65–99)
Glucose, Bld: 120 mg/dL — ABNORMAL HIGH (ref 65–99)
Glucose, Bld: 138 mg/dL — ABNORMAL HIGH (ref 65–99)
HCT: 30 % — ABNORMAL LOW (ref 39.0–52.0)
HCT: 21 % — ABNORMAL LOW (ref 39.0–52.0)
HCT: 28 % — ABNORMAL LOW (ref 39.0–52.0)
HCT: 22 % — ABNORMAL LOW (ref 39.0–52.0)
HCT: 21 % — ABNORMAL LOW (ref 39.0–52.0)
HCT: 23 % — ABNORMAL LOW (ref 39.0–52.0)
Hemoglobin: 10.2 g/dL — ABNORMAL LOW (ref 13.0–17.0)
Hemoglobin: 7.1 g/dL — ABNORMAL LOW (ref 13.0–17.0)
Hemoglobin: 9.5 g/dL — ABNORMAL LOW (ref 13.0–17.0)
Hemoglobin: 7.5 g/dL — ABNORMAL LOW (ref 13.0–17.0)
Hemoglobin: 7.1 g/dL — ABNORMAL LOW (ref 13.0–17.0)
Hemoglobin: 7.8 g/dL — ABNORMAL LOW (ref 13.0–17.0)
Potassium: 4.2 mmol/L (ref 3.5–5.1)
Potassium: 4.1 mmol/L (ref 3.5–5.1)
Potassium: 4.1 mmol/L (ref 3.5–5.1)
Potassium: 4.4 mmol/L (ref 3.5–5.1)
Potassium: 4.2 mmol/L (ref 3.5–5.1)
Potassium: 4.3 mmol/L (ref 3.5–5.1)
Sodium: 135 mmol/L (ref 135–145)
Sodium: 136 mmol/L (ref 135–145)
Sodium: 138 mmol/L (ref 135–145)
Sodium: 137 mmol/L (ref 135–145)
Sodium: 136 mmol/L (ref 135–145)
Sodium: 138 mmol/L (ref 135–145)
TCO2: 30 mmol/L (ref 0–100)
TCO2: 30 mmol/L (ref 0–100)
TCO2: 31 mmol/L (ref 0–100)
TCO2: 30 mmol/L (ref 0–100)
TCO2: 28 mmol/L (ref 0–100)
TCO2: 25 mmol/L (ref 0–100)

## 2016-06-23 LAB — PREPARE RBC (CROSSMATCH)

## 2016-06-23 LAB — GLUCOSE, CAPILLARY
Glucose-Capillary: 130 mg/dL — ABNORMAL HIGH (ref 65–99)
Glucose-Capillary: 128 mg/dL — ABNORMAL HIGH (ref 65–99)
Glucose-Capillary: 148 mg/dL — ABNORMAL HIGH (ref 65–99)
Glucose-Capillary: 141 mg/dL — ABNORMAL HIGH (ref 65–99)
Glucose-Capillary: 136 mg/dL — ABNORMAL HIGH (ref 65–99)
Glucose-Capillary: 127 mg/dL — ABNORMAL HIGH (ref 65–99)
Glucose-Capillary: 117 mg/dL — ABNORMAL HIGH (ref 65–99)
Glucose-Capillary: 126 mg/dL — ABNORMAL HIGH (ref 65–99)
Glucose-Capillary: 153 mg/dL — ABNORMAL HIGH (ref 65–99)

## 2016-06-23 LAB — PROTIME-INR
INR: 1.5
Prothrombin Time: 18.3 seconds — ABNORMAL HIGH (ref 11.4–15.2)

## 2016-06-23 LAB — HEMOGLOBIN AND HEMATOCRIT, BLOOD
HCT: 23.6 % — ABNORMAL LOW (ref 39.0–52.0)
Hemoglobin: 8 g/dL — ABNORMAL LOW (ref 13.0–17.0)

## 2016-06-23 LAB — APTT: APTT: 34 s (ref 24–36)

## 2016-06-23 LAB — CREATININE, SERUM
Creatinine, Ser: 1.19 mg/dL (ref 0.61–1.24)
GFR calc Af Amer: >60 mL/min (ref >60)
GFR calc non Af Amer: >60 mL/min (ref >60)

## 2016-06-23 LAB — PLATELET COUNT: Platelets: 159 10*3/uL (ref 150–400)

## 2016-06-23 SURGERY — REPLACEMENT, AORTIC VALVE, OPEN
Anesthesia: General | Site: Chest

## 2016-06-23 MED ORDER — SODIUM CHLORIDE 0.9 % IJ SOLN
OROMUCOSAL | Status: DC | PRN
  Administered 2016-06-23 (×4): 4 mL via TOPICAL

## 2016-06-23 MED ORDER — DICLOFENAC SODIUM 50 MG PO TBEC
50.0000 mg | DELAYED_RELEASE_TABLET | Freq: Two times a day (BID) | ORAL | Status: DC
  Filled 2016-06-23: qty 1

## 2016-06-23 MED ORDER — VANCOMYCIN HCL IN DEXTROSE 1-5 GM/200ML-% IV SOLN
1000.0000 mg | Freq: Two times a day (BID) | INTRAVENOUS | Status: AC
  Administered 2016-06-23 – 2016-06-24 (×3): 1000 mg via INTRAVENOUS
  Filled 2016-06-23 (×3): qty 200

## 2016-06-23 MED ORDER — PHENYLEPHRINE HCL 10 MG/ML IJ SOLN
0.0000 ug/min | INTRAMUSCULAR | Status: DC
  Administered 2016-06-23: 5 ug/min via INTRAVENOUS
  Filled 2016-06-23 (×2): qty 2

## 2016-06-23 MED ORDER — MIDAZOLAM HCL 10 MG/2ML IJ SOLN
INTRAMUSCULAR | Status: AC
  Filled 2016-06-23: qty 2

## 2016-06-23 MED ORDER — ORAL CARE MOUTH RINSE
15.0000 mL | Freq: Four times a day (QID) | OROMUCOSAL | Status: DC
  Administered 2016-06-23 – 2016-06-24 (×3): 15 mL via OROMUCOSAL

## 2016-06-23 MED ORDER — FENTANYL CITRATE (PF) 250 MCG/5ML IJ SOLN
INTRAMUSCULAR | Status: AC
  Filled 2016-06-23: qty 30

## 2016-06-23 MED ORDER — MIDAZOLAM HCL 5 MG/5ML IJ SOLN
INTRAMUSCULAR | Status: DC | PRN
  Administered 2016-06-23: 1 mg via INTRAVENOUS
  Administered 2016-06-23: 4 mg via INTRAVENOUS
  Administered 2016-06-23: 2 mg via INTRAVENOUS
  Administered 2016-06-23: 3 mg via INTRAVENOUS
  Administered 2016-06-23: 2 mg via INTRAVENOUS

## 2016-06-23 MED ORDER — SODIUM CHLORIDE 0.9 % IR SOLN
Status: DC | PRN
  Administered 2016-06-23: 6000 mL

## 2016-06-23 MED ORDER — MORPHINE SULFATE (PF) 2 MG/ML IV SOLN: 2.0000 mg | INTRAVENOUS | Status: DC | PRN

## 2016-06-23 MED ORDER — ACETAMINOPHEN 160 MG/5ML PO SOLN
1000.0000 mg | Freq: Four times a day (QID) | ORAL | Status: DC
  Administered 2016-06-24: 1000 mg
  Filled 2016-06-23: qty 40.6

## 2016-06-23 MED ORDER — MILRINONE LACTATE IN DEXTROSE 20-5 MG/100ML-% IV SOLN
0.1250 ug/kg/min | INTRAVENOUS | Status: DC
  Administered 2016-06-23: 0.25 ug/kg/min via INTRAVENOUS
  Filled 2016-06-23: qty 100

## 2016-06-23 MED ORDER — OXYCODONE HCL 5 MG PO TABS
5.0000 mg | ORAL_TABLET | ORAL | Status: DC | PRN
  Administered 2016-06-24 – 2016-06-28 (×23): 10 mg via ORAL
  Filled 2016-06-23 (×23): qty 2

## 2016-06-23 MED ORDER — SODIUM CHLORIDE 0.9 % IV SOLN
0.0000 ug/kg/h | INTRAVENOUS | Status: DC
  Administered 2016-06-23 (×3): 0.7 ug/kg/h via INTRAVENOUS
  Filled 2016-06-23 (×4): qty 2

## 2016-06-23 MED ORDER — CHLORHEXIDINE GLUCONATE 0.12 % MT SOLN
15.0000 mL | Freq: Once | OROMUCOSAL | Status: AC
  Administered 2016-06-23: 15 mL via OROMUCOSAL
  Filled 2016-06-23: qty 15

## 2016-06-23 MED ORDER — METOPROLOL TARTRATE 25 MG/10 ML ORAL SUSPENSION: 12.5000 mg | Freq: Two times a day (BID) | ORAL | Status: DC

## 2016-06-23 MED ORDER — ACETAMINOPHEN 500 MG PO TABS
1000.0000 mg | ORAL_TABLET | Freq: Four times a day (QID) | ORAL | Status: DC
  Administered 2016-06-24 – 2016-06-28 (×15): 1000 mg via ORAL
  Filled 2016-06-23 (×15): qty 2

## 2016-06-23 MED ORDER — LACTATED RINGERS IV SOLN
INTRAVENOUS | Status: DC | PRN
  Administered 2016-06-23: 07:00:00 via INTRAVENOUS

## 2016-06-23 MED ORDER — PROTAMINE SULFATE 10 MG/ML IV SOLN
INTRAVENOUS | Status: AC
  Filled 2016-06-23: qty 20

## 2016-06-23 MED ORDER — ROCURONIUM BROMIDE 100 MG/10ML IV SOLN
INTRAVENOUS | Status: DC | PRN
  Administered 2016-06-23 (×4): 50 mg via INTRAVENOUS

## 2016-06-23 MED ORDER — PROPOFOL 10 MG/ML IV BOLUS
INTRAVENOUS | Status: AC
  Filled 2016-06-23: qty 20

## 2016-06-23 MED ORDER — CHLORHEXIDINE GLUCONATE 4 % EX LIQD: 30.0000 mL | CUTANEOUS | Status: DC

## 2016-06-23 MED ORDER — LACTATED RINGERS IV SOLN: INTRAVENOUS | Status: DC

## 2016-06-23 MED ORDER — MIDAZOLAM HCL 2 MG/2ML IJ SOLN
2.0000 mg | INTRAMUSCULAR | Status: DC | PRN
  Filled 2016-06-23: qty 2

## 2016-06-23 MED ORDER — MIDAZOLAM HCL 2 MG/2ML IJ SOLN
INTRAMUSCULAR | Status: AC
  Filled 2016-06-23: qty 2

## 2016-06-23 MED ORDER — SODIUM CHLORIDE 0.45 % IV SOLN: INTRAVENOUS | Status: DC | PRN

## 2016-06-23 MED ORDER — HEMOSTATIC AGENTS (NO CHARGE) OPTIME
TOPICAL | Status: DC | PRN
  Administered 2016-06-23: 1 via TOPICAL

## 2016-06-23 MED ORDER — PANTOPRAZOLE SODIUM 40 MG PO TBEC
40.0000 mg | DELAYED_RELEASE_TABLET | Freq: Every day | ORAL | Status: DC
  Administered 2016-06-25 – 2016-06-28 (×4): 40 mg via ORAL
  Filled 2016-06-23 (×4): qty 1

## 2016-06-23 MED ORDER — SODIUM CHLORIDE 0.9 % IV SOLN
Freq: Once | INTRAVENOUS | Status: AC
  Administered 2016-06-23: 16:00:00 via INTRAVENOUS

## 2016-06-23 MED ORDER — MAGNESIUM SULFATE 4 GM/100ML IV SOLN
4.0000 g | Freq: Once | INTRAVENOUS | Status: AC
  Administered 2016-06-23: 4 g via INTRAVENOUS
  Filled 2016-06-23: qty 100

## 2016-06-23 MED ORDER — SODIUM CHLORIDE 0.9 % IV SOLN
INTRAVENOUS | Status: DC
  Administered 2016-06-23: 1.3 [IU]/h via INTRAVENOUS
  Filled 2016-06-23 (×2): qty 2.5

## 2016-06-23 MED ORDER — MORPHINE SULFATE (PF) 4 MG/ML IV SOLN
1.0000 mg | INTRAVENOUS | Status: AC | PRN
  Administered 2016-06-23: 2 mg via INTRAVENOUS

## 2016-06-23 MED ORDER — HEPARIN SODIUM (PORCINE) 1000 UNIT/ML IJ SOLN
INTRAMUSCULAR | Status: AC
  Filled 2016-06-23: qty 1

## 2016-06-23 MED ORDER — ALBUMIN HUMAN 5 % IV SOLN
250.0000 mL | INTRAVENOUS | Status: AC | PRN
  Administered 2016-06-23 (×2): 250 mL via INTRAVENOUS
  Filled 2016-06-23: qty 250

## 2016-06-23 MED ORDER — ACETAMINOPHEN 650 MG RE SUPP
650.0000 mg | Freq: Once | RECTAL | Status: AC
  Administered 2016-06-23: 650 mg via RECTAL

## 2016-06-23 MED ORDER — SODIUM CHLORIDE 0.9% FLUSH
3.0000 mL | Freq: Two times a day (BID) | INTRAVENOUS | Status: DC
  Administered 2016-06-24 – 2016-06-27 (×6): 3 mL via INTRAVENOUS

## 2016-06-23 MED ORDER — BUDESONIDE 0.5 MG/2ML IN SUSP
0.5000 mg | Freq: Two times a day (BID) | RESPIRATORY_TRACT | Status: DC
Start: 2016-06-23 — End: 2016-06-28
  Administered 2016-06-23 – 2016-06-28 (×9): 0.5 mg via RESPIRATORY_TRACT
  Filled 2016-06-23 (×9): qty 2

## 2016-06-23 MED ORDER — FENTANYL CITRATE (PF) 250 MCG/5ML IJ SOLN
INTRAMUSCULAR | Status: DC | PRN
  Administered 2016-06-23: 100 ug via INTRAVENOUS
  Administered 2016-06-23: 250 ug via INTRAVENOUS
  Administered 2016-06-23: 350 ug via INTRAVENOUS
  Administered 2016-06-23 (×3): 250 ug via INTRAVENOUS
  Administered 2016-06-23: 50 ug via INTRAVENOUS

## 2016-06-23 MED ORDER — ONDANSETRON HCL 4 MG/2ML IJ SOLN: 4.0000 mg | Freq: Four times a day (QID) | INTRAMUSCULAR | Status: DC | PRN

## 2016-06-23 MED ORDER — PHENYLEPHRINE HCL 10 MG/ML IJ SOLN
INTRAMUSCULAR | Status: DC | PRN
  Administered 2016-06-23: 40 ug via INTRAVENOUS
  Administered 2016-06-23 (×2): 10 ug via INTRAVENOUS

## 2016-06-23 MED ORDER — TRAMADOL HCL 50 MG PO TABS: 50.0000 mg | ORAL_TABLET | ORAL | Status: DC | PRN

## 2016-06-23 MED ORDER — ASPIRIN 81 MG PO CHEW: 324.0000 mg | CHEWABLE_TABLET | Freq: Every day | ORAL | Status: DC

## 2016-06-23 MED ORDER — ARTIFICIAL TEARS OP OINT
TOPICAL_OINTMENT | OPHTHALMIC | Status: DC | PRN
  Administered 2016-06-23: 1 via OPHTHALMIC

## 2016-06-23 MED ORDER — SODIUM CHLORIDE 0.9 % IV SOLN
20.0000 ug | Freq: Once | INTRAVENOUS | Status: AC
  Administered 2016-06-23: 20 ug via INTRAVENOUS
  Filled 2016-06-23: qty 5

## 2016-06-23 MED ORDER — ROCURONIUM BROMIDE 50 MG/5ML IV SOSY
PREFILLED_SYRINGE | INTRAVENOUS | Status: AC
  Filled 2016-06-23: qty 5

## 2016-06-23 MED ORDER — SERTRALINE HCL 50 MG PO TABS
50.0000 mg | ORAL_TABLET | Freq: Every day | ORAL | Status: DC
  Administered 2016-06-24 – 2016-06-28 (×4): 50 mg via ORAL
  Filled 2016-06-23 (×5): qty 1

## 2016-06-23 MED ORDER — DEXTROSE 5 % IV SOLN
1.5000 g | Freq: Two times a day (BID) | INTRAVENOUS | Status: AC
  Administered 2016-06-23 – 2016-06-25 (×4): 1.5 g via INTRAVENOUS
  Filled 2016-06-23 (×4): qty 1.5

## 2016-06-23 MED ORDER — VANCOMYCIN HCL IN DEXTROSE 1-5 GM/200ML-% IV SOLN
1000.0000 mg | Freq: Once | INTRAVENOUS | Status: DC
  Filled 2016-06-23: qty 200

## 2016-06-23 MED ORDER — ACETAMINOPHEN 160 MG/5ML PO SOLN: 650.0000 mg | Freq: Once | ORAL | Status: AC

## 2016-06-23 MED ORDER — NITROGLYCERIN IN D5W 200-5 MCG/ML-% IV SOLN: 0.0000 ug/min | INTRAVENOUS | Status: DC

## 2016-06-23 MED ORDER — PROTAMINE SULFATE 10 MG/ML IV SOLN
INTRAVENOUS | Status: AC
  Filled 2016-06-23: qty 25

## 2016-06-23 MED ORDER — LACTATED RINGERS IV SOLN
INTRAVENOUS | Status: DC
  Administered 2016-06-24: 20 mL/h via INTRAVENOUS

## 2016-06-23 MED ORDER — MILRINONE LACTATE IN DEXTROSE 20-5 MG/100ML-% IV SOLN
0.1250 ug/kg/min | Freq: Once | INTRAVENOUS | Status: AC
  Administered 2016-06-23: .25 ug/kg/min via INTRAVENOUS
  Filled 2016-06-23: qty 100

## 2016-06-23 MED ORDER — SODIUM CHLORIDE 0.9 % IV SOLN: INTRAVENOUS | Status: DC

## 2016-06-23 MED ORDER — MORPHINE SULFATE (PF) 4 MG/ML IV SOLN
2.0000 mg | INTRAVENOUS | Status: DC | PRN
  Administered 2016-06-23 – 2016-06-24 (×2): 4 mg via INTRAVENOUS
  Filled 2016-06-23 (×4): qty 1

## 2016-06-23 MED ORDER — CHLORHEXIDINE GLUCONATE 0.12 % MT SOLN
15.0000 mL | OROMUCOSAL | Status: AC
  Administered 2016-06-23: 15 mL via OROMUCOSAL

## 2016-06-23 MED ORDER — SODIUM CHLORIDE 0.9 % IJ SOLN
INTRAMUSCULAR | Status: AC
  Filled 2016-06-23: qty 10

## 2016-06-23 MED ORDER — DEXMEDETOMIDINE HCL IN NACL 400 MCG/100ML IV SOLN
0.1000 ug/kg/h | INTRAVENOUS | Status: DC
  Filled 2016-06-23: qty 100

## 2016-06-23 MED ORDER — ARTIFICIAL TEARS OP OINT
TOPICAL_OINTMENT | OPHTHALMIC | Status: AC
  Filled 2016-06-23: qty 3.5

## 2016-06-23 MED ORDER — ROCURONIUM BROMIDE 50 MG/5ML IV SOSY
PREFILLED_SYRINGE | INTRAVENOUS | Status: AC
  Filled 2016-06-23: qty 10

## 2016-06-23 MED ORDER — SODIUM CHLORIDE 0.9 % IV SOLN
17.0000 ug | Freq: Once | INTRAVENOUS | Status: DC
  Filled 2016-06-23: qty 4.3

## 2016-06-23 MED ORDER — BISACODYL 5 MG PO TBEC
10.0000 mg | DELAYED_RELEASE_TABLET | Freq: Every day | ORAL | Status: DC
  Administered 2016-06-24: 10 mg via ORAL
  Filled 2016-06-23 (×2): qty 2

## 2016-06-23 MED ORDER — SODIUM CHLORIDE 0.9 % IV SOLN
Freq: Once | INTRAVENOUS | Status: AC
Start: 2016-06-23 — End: 2016-06-23
  Administered 2016-06-23: 15:00:00 via INTRAVENOUS

## 2016-06-23 MED ORDER — SODIUM CHLORIDE 0.9 % IV SOLN: 250.0000 mL | INTRAVENOUS | Status: DC

## 2016-06-23 MED ORDER — SODIUM CHLORIDE 0.9 % IV SOLN
INTRAVENOUS | Status: DC | PRN
  Administered 2016-06-23: 12:00:00 via INTRAVENOUS

## 2016-06-23 MED ORDER — INSULIN REGULAR BOLUS VIA INFUSION
0.0000 [IU] | Freq: Three times a day (TID) | INTRAVENOUS | Status: DC
  Filled 2016-06-23: qty 10

## 2016-06-23 MED ORDER — METOPROLOL TARTRATE 12.5 MG HALF TABLET
12.5000 mg | ORAL_TABLET | Freq: Two times a day (BID) | ORAL | Status: DC
  Administered 2016-06-24: 12.5 mg via ORAL
  Filled 2016-06-23: qty 1

## 2016-06-23 MED ORDER — BISACODYL 10 MG RE SUPP: 10.0000 mg | Freq: Every day | RECTAL | Status: DC

## 2016-06-23 MED ORDER — LACTATED RINGERS IV SOLN: 500.0000 mL | Freq: Once | INTRAVENOUS | Status: DC | PRN

## 2016-06-23 MED ORDER — MORPHINE SULFATE (PF) 2 MG/ML IV SOLN: 1.0000 mg | INTRAVENOUS | Status: DC | PRN

## 2016-06-23 MED ORDER — ASPIRIN EC 325 MG PO TBEC
325.0000 mg | DELAYED_RELEASE_TABLET | Freq: Every day | ORAL | Status: DC
  Administered 2016-06-24 – 2016-06-28 (×5): 325 mg via ORAL
  Filled 2016-06-23 (×5): qty 1

## 2016-06-23 MED ORDER — PROPOFOL 10 MG/ML IV BOLUS
INTRAVENOUS | Status: DC | PRN
  Administered 2016-06-23: 40 mg via INTRAVENOUS

## 2016-06-23 MED ORDER — DOCUSATE SODIUM 100 MG PO CAPS
200.0000 mg | ORAL_CAPSULE | Freq: Every day | ORAL | Status: DC
  Administered 2016-06-24: 200 mg via ORAL
  Filled 2016-06-23 (×2): qty 2

## 2016-06-23 MED ORDER — HEPARIN SODIUM (PORCINE) 1000 UNIT/ML IJ SOLN
INTRAMUSCULAR | Status: DC | PRN
  Administered 2016-06-23: 25000 [IU] via INTRAVENOUS

## 2016-06-23 MED ORDER — ROSUVASTATIN CALCIUM 10 MG PO TABS
20.0000 mg | ORAL_TABLET | Freq: Every day | ORAL | Status: DC
  Administered 2016-06-24 – 2016-06-27 (×4): 20 mg via ORAL
  Filled 2016-06-23: qty 1
  Filled 2016-06-23 (×2): qty 2
  Filled 2016-06-23: qty 1

## 2016-06-23 MED ORDER — FAMOTIDINE IN NACL 20-0.9 MG/50ML-% IV SOLN
20.0000 mg | Freq: Two times a day (BID) | INTRAVENOUS | Status: AC
  Administered 2016-06-23 (×2): 20 mg via INTRAVENOUS
  Filled 2016-06-23: qty 50

## 2016-06-23 MED ORDER — METOPROLOL TARTRATE 5 MG/5ML IV SOLN
2.5000 mg | INTRAVENOUS | Status: DC | PRN
  Administered 2016-06-24: 5 mg via INTRAVENOUS
  Filled 2016-06-23: qty 5

## 2016-06-23 MED ORDER — PROTAMINE SULFATE 10 MG/ML IV SOLN
INTRAVENOUS | Status: DC | PRN
  Administered 2016-06-23: 250 mg via INTRAVENOUS
  Administered 2016-06-23: 50 mg via INTRAVENOUS

## 2016-06-23 MED ORDER — CHLORHEXIDINE GLUCONATE 0.12% ORAL RINSE (MEDLINE KIT)
15.0000 mL | Freq: Two times a day (BID) | OROMUCOSAL | Status: DC
  Administered 2016-06-23 – 2016-06-24 (×3): 15 mL via OROMUCOSAL

## 2016-06-23 MED ORDER — COAGULATION FACTOR VIIA RECOMB 1 MG IV SOLR
4.0000 mg | Freq: Once | INTRAVENOUS | Status: AC
  Administered 2016-06-23: 2 mg via INTRAVENOUS
  Filled 2016-06-23: qty 4

## 2016-06-23 MED ORDER — ALBUMIN HUMAN 5 % IV SOLN
INTRAVENOUS | Status: DC | PRN
  Administered 2016-06-23: 12:00:00 via INTRAVENOUS

## 2016-06-23 MED ORDER — ALPRAZOLAM 0.5 MG PO TABS
0.5000 mg | ORAL_TABLET | Freq: Three times a day (TID) | ORAL | Status: DC | PRN
  Administered 2016-06-24 – 2016-06-27 (×4): 0.5 mg via ORAL
  Filled 2016-06-23 (×4): qty 1

## 2016-06-23 MED ORDER — SODIUM CHLORIDE 0.9 % IV SOLN
30.0000 meq | Freq: Once | INTRAVENOUS | Status: AC
  Administered 2016-06-23: 30 meq via INTRAVENOUS
  Filled 2016-06-23: qty 15

## 2016-06-23 MED ORDER — SODIUM CHLORIDE 0.9% FLUSH: 3.0000 mL | INTRAVENOUS | Status: DC | PRN

## 2016-06-23 MED FILL — Potassium Chloride Inj 2 mEq/ML: INTRAVENOUS | Qty: 40 | Status: AC

## 2016-06-23 MED FILL — Magnesium Sulfate Inj 50%: INTRAMUSCULAR | Qty: 10 | Status: AC

## 2016-06-23 MED FILL — Heparin Sodium (Porcine) Inj 1000 Unit/ML: INTRAMUSCULAR | Qty: 30 | Status: AC

## 2016-06-23 MED FILL — Heparin Sodium (Porcine) Inj 1000 Unit/ML: INTRAMUSCULAR | Qty: 2500 | Status: AC

## 2016-06-23 SURGICAL SUPPLY — 88 items
ADAPTER CARDIO PERF ANTE/RETRO (ADAPTER) ×4 IMPLANT
ADH SRG 12 PREFL SYR 3 SPRDR (MISCELLANEOUS)
ADPR PRFSN 84XANTGRD RTRGD (ADAPTER) ×2
AGENT HMST KT MTR STRL THRMB (HEMOSTASIS) ×2
BAG DECANTER FOR FLEXI CONT (MISCELLANEOUS) ×4 IMPLANT
BLADE STERNUM SYSTEM 6 (BLADE) ×4 IMPLANT
BLADE SURG 12 STRL SS (BLADE) ×4 IMPLANT
BLADE SURG 15 STRL LF DISP TIS (BLADE) ×2 IMPLANT
BLADE SURG 15 STRL SS (BLADE) ×4
CANISTER SUCT 3000ML PPV (MISCELLANEOUS) ×4 IMPLANT
CANNULA GUNDRY RCSP 15FR (MISCELLANEOUS) ×4 IMPLANT
CATH CPB KIT VANTRIGT (MISCELLANEOUS) ×4 IMPLANT
CATH HEART VENT LEFT (CATHETERS) ×2 IMPLANT
CATH RETROPLEGIA CORONARY 14FR (CATHETERS) IMPLANT
CATH ROBINSON RED A/P 18FR (CATHETERS) ×12 IMPLANT
CATH THORACIC 36FR RT ANG (CATHETERS) ×4 IMPLANT
CHLORAPREP W/TINT 10.5 ML (MISCELLANEOUS) ×2 IMPLANT
CLIP FOGARTY SPRING 6M (CLIP) IMPLANT
CONT SPEC 4OZ CLIKSEAL STRL BL (MISCELLANEOUS) ×2 IMPLANT
COVER SURGICAL LIGHT HANDLE (MISCELLANEOUS) ×8 IMPLANT
CRADLE DONUT ADULT HEAD (MISCELLANEOUS) ×4 IMPLANT
DRAIN CHANNEL 32F RND 10.7 FF (WOUND CARE) ×4 IMPLANT
DRAPE CARDIOVASCULAR INCISE (DRAPES) ×4
DRAPE SLUSH/WARMER DISC (DRAPES) ×4 IMPLANT
DRAPE SRG 135X102X78XABS (DRAPES) ×2 IMPLANT
DRSG AQUACEL AG ADV 3.5X14 (GAUZE/BANDAGES/DRESSINGS) ×4 IMPLANT
ELECT BLADE 4.0 EZ CLEAN MEGAD (MISCELLANEOUS) ×4
ELECT BLADE 6.5 EXT (BLADE) ×4 IMPLANT
ELECT CAUTERY BLADE 6.4 (BLADE) ×4 IMPLANT
ELECT REM PT RETURN 9FT ADLT (ELECTROSURGICAL) ×8
ELECTRODE BLDE 4.0 EZ CLN MEGD (MISCELLANEOUS) ×2 IMPLANT
ELECTRODE REM PT RTRN 9FT ADLT (ELECTROSURGICAL) ×4 IMPLANT
FELT TEFLON 1X6 (MISCELLANEOUS) ×10 IMPLANT
GAUZE SPONGE 4X4 12PLY STRL (GAUZE/BANDAGES/DRESSINGS) ×8 IMPLANT
GAUZE SPONGE 4X4 12PLY STRL LF (GAUZE/BANDAGES/DRESSINGS) ×2 IMPLANT
GLOVE BIO SURGEON STRL SZ 6.5 (GLOVE) ×1 IMPLANT
GLOVE BIO SURGEON STRL SZ7.5 (GLOVE) ×14 IMPLANT
GLOVE BIO SURGEONS STRL SZ 6.5 (GLOVE) ×1
GLOVE BIOGEL PI IND STRL 6 (GLOVE) IMPLANT
GLOVE BIOGEL PI INDICATOR 6 (GLOVE) ×4
GOWN STRL REUS W/ TWL LRG LVL3 (GOWN DISPOSABLE) ×8 IMPLANT
GOWN STRL REUS W/TWL LRG LVL3 (GOWN DISPOSABLE) ×16
HEMOSTAT POWDER SURGIFOAM 1G (HEMOSTASIS) ×12 IMPLANT
HEMOSTAT SURGICEL 2X14 (HEMOSTASIS) ×4 IMPLANT
INSERT FOGARTY XLG (MISCELLANEOUS) IMPLANT
KIT BASIN OR (CUSTOM PROCEDURE TRAY) ×4 IMPLANT
KIT ROOM TURNOVER OR (KITS) ×4 IMPLANT
KIT SUCTION CATH 14FR (SUCTIONS) ×4 IMPLANT
LEAD PACING MYOCARDI (MISCELLANEOUS) ×4 IMPLANT
LINE VENT (MISCELLANEOUS) ×2 IMPLANT
NS IRRIG 1000ML POUR BTL (IV SOLUTION) ×26 IMPLANT
PACK OPEN HEART (CUSTOM PROCEDURE TRAY) ×4 IMPLANT
PAD ARMBOARD 7.5X6 YLW CONV (MISCELLANEOUS) ×8 IMPLANT
POWDER SURGICEL 3.0 GRAM (HEMOSTASIS) ×2 IMPLANT
SET CARDIOPLEGIA MPS 5001102 (MISCELLANEOUS) ×2 IMPLANT
SURGIFLO W/THROMBIN 8M KIT (HEMOSTASIS) ×5 IMPLANT
SUT BONE WAX W31G (SUTURE) ×4 IMPLANT
SUT ETHIBON 2 0 V 52N 30 (SUTURE) ×8 IMPLANT
SUT ETHIBOND 2 0 SH (SUTURE) ×4
SUT ETHIBOND 2 0 SH 36X2 (SUTURE) ×2 IMPLANT
SUT PROLENE 3 0 RB 1 (SUTURE) ×4 IMPLANT
SUT PROLENE 3 0 SH 1 (SUTURE) IMPLANT
SUT PROLENE 3 0 SH DA (SUTURE) IMPLANT
SUT PROLENE 4 0 RB 1 (SUTURE) ×72
SUT PROLENE 4 0 SH DA (SUTURE) ×8 IMPLANT
SUT PROLENE 4-0 RB1 .5 CRCL 36 (SUTURE) ×6 IMPLANT
SUT PROLENE 6 0 C 1 30 (SUTURE) ×8 IMPLANT
SUT SILK 1 TIES 10X30 (SUTURE) ×2 IMPLANT
SUT SILK 2 0 SH CR/8 (SUTURE) ×2 IMPLANT
SUT STEEL 6MS V (SUTURE) ×8 IMPLANT
SUT STEEL SZ 6 DBL 3X14 BALL (SUTURE) ×4 IMPLANT
SUT VIC AB 1 CTX 36 (SUTURE) ×12
SUT VIC AB 1 CTX36XBRD ANBCTR (SUTURE) ×4 IMPLANT
SUT VIC AB 2-0 CTX 27 (SUTURE) IMPLANT
SUT VIC AB 3-0 X1 27 (SUTURE) IMPLANT
SWABSTICK BENZOIN STERILE (MISCELLANEOUS) ×2 IMPLANT
SYR 10ML KIT SKIN ADHESIVE (MISCELLANEOUS) IMPLANT
SYSTEM SAHARA CHEST DRAIN ATS (WOUND CARE) ×4 IMPLANT
TAPE CLOTH SURG 4X10 WHT LF (GAUZE/BANDAGES/DRESSINGS) ×2 IMPLANT
TOWEL GREEN STERILE (TOWEL DISPOSABLE) ×16 IMPLANT
TOWEL GREEN STERILE FF (TOWEL DISPOSABLE) ×8 IMPLANT
TOWEL OR 17X24 6PK STRL BLUE (TOWEL DISPOSABLE) ×8 IMPLANT
TOWEL OR 17X26 10 PK STRL BLUE (TOWEL DISPOSABLE) ×8 IMPLANT
TRAY FOLEY SILVER 16FR TEMP (SET/KITS/TRAYS/PACK) ×4 IMPLANT
UNDERPAD 30X30 (UNDERPADS AND DIAPERS) ×4 IMPLANT
VALVE MAGNA EASE 21MM (Prosthesis & Implant Heart) ×2 IMPLANT
VENT LEFT HEART 12002 (CATHETERS) ×4
WATER STERILE IRR 1000ML POUR (IV SOLUTION) ×8 IMPLANT

## 2016-06-23 NOTE — OR Nursing (Signed)
11:35am - 1st call to SICU charge nurse

## 2016-06-23 NOTE — Progress Notes (Signed)
  Echocardiogram Echocardiogram Transesophageal has been performed.  Roseanna Rainbow R 06/23/2016, 9:00 AM

## 2016-06-23 NOTE — Progress Notes (Signed)
Dr. Prescott Gum aware of chest tube drainage.  Orders received for Factor 7 once Cryo in.

## 2016-06-23 NOTE — Progress Notes (Signed)
Dr. Prescott Gum aware of chest tube drainage.  Orders received.  Will continue to monitor.  Peep increased per Dr. Prescott Gum

## 2016-06-23 NOTE — Progress Notes (Signed)
Dr. Prescott Gum aware of chest tube drainage.  Continuing to monitor and give blood products.

## 2016-06-23 NOTE — Anesthesia Preprocedure Evaluation (Addendum)
Anesthesia Evaluation  Patient identified by MRN, date of birth, ID band Patient awake    Reviewed: Allergy & Precautions, NPO status , Patient's Chart, lab work & pertinent test results  History of Anesthesia Complications Negative for: history of anesthetic complications  Airway Mallampati: II  TM Distance: >3 FB Neck ROM: Full    Dental  (+) Teeth Intact   Pulmonary former smoker,    breath sounds clear to auscultation       Cardiovascular + CAD  + Valvular Problems/Murmurs AS and AI  Rhythm:Regular + Systolic murmurs    Neuro/Psych PSYCHIATRIC DISORDERS Anxiety Depression negative neurological ROS     GI/Hepatic negative GI ROS, Neg liver ROS,   Endo/Other  negative endocrine ROS  Renal/GU negative Renal ROS     Musculoskeletal  (+) Arthritis ,   Abdominal   Peds  Hematology negative hematology ROS (+)   Anesthesia Other Findings   Reproductive/Obstetrics                           Anesthesia Physical Anesthesia Plan  ASA: IV  Anesthesia Plan: General   Post-op Pain Management:    Induction: Intravenous  Airway Management Planned: Oral ETT  Additional Equipment: Arterial line, CVP, PA Cath, Ultrasound Guidance Line Placement and TEE  Intra-op Plan:   Post-operative Plan: Post-operative intubation/ventilation  Informed Consent: I have reviewed the patients History and Physical, chart, labs and discussed the procedure including the risks, benefits and alternatives for the proposed anesthesia with the patient or authorized representative who has indicated his/her understanding and acceptance.   Dental advisory given  Plan Discussed with: CRNA and Surgeon  Anesthesia Plan Comments:         Anesthesia Quick Evaluation

## 2016-06-23 NOTE — Progress Notes (Signed)
Dr. Prescott Gum in unit and updated on chest tube drainage.  PCXR done as per request.  Spoke with son and updated on status.

## 2016-06-23 NOTE — Brief Op Note (Signed)
06/23/2016  11:22 AM  PATIENT:  Carl Hunt  61 y.o. male  PRE-OPERATIVE DIAGNOSIS:  AS  POST-OPERATIVE DIAGNOSIS:  AS  PROCEDURE:  Procedure(s): AORTIC VALVE REPLACEMENT (AVR) (N/A) TRANSESOPHAGEAL ECHOCARDIOGRAM (TEE) (N/A) #21 EDWARDS PERICARDIAL BIOPROSTHETIC  SURGEON:  Surgeon(s) and Role:    * Ivin Poot, MD - Primary  PHYSICIAN ASSISTANT: WAYNE GOLD PA-C  ANESTHESIA:   general  EBL:  Total I/O In: 600 [I.V.:600] Out: 1100 [Urine:1100]  BLOOD ADMINISTERED:2 FFP  DRAINS: ROUTINE   LOCAL MEDICATIONS USED:  NONE  SPECIMEN:  Source of Specimen:  AORTIC VALVE LEAFLETS  DISPOSITION OF SPECIMEN:  PATHOLOGY  COUNTS:  YES  TOURNIQUET:  * No tourniquets in log *  DICTATION: .Other Dictation: Dictation Number PENDING  PLAN OF CARE: Admit to inpatient   PATIENT DISPOSITION:  ICU - intubated and hemodynamically stable.   Delay start of Pharmacological VTE agent (>24hrs) due to surgical blood loss or risk of bleeding: yes  COMPLICATIONS: NO KNOWN

## 2016-06-23 NOTE — OR Nursing (Signed)
12:15pm - 2nd call to SICU

## 2016-06-23 NOTE — Progress Notes (Signed)
Patient ID: Carl Hunt, male   DOB: [birth year], 61 y.o.   MRN: [id number]   SICU Evening Rounds:   Hemodynamically stable  CI = 1.9 on Milrinone 0.25  Still on vent. Was bleeding early postop and received PRBC's and clotting factors.  Urine output good  CT output low now.  CBC    Component Value Date/Time   WBC 10.1 06/23/2016 1249   RBC 2.73 (L) 06/23/2016 1249   HGB 8.2 (L) 06/23/2016 1256   HCT 24.0 (L) 06/23/2016 1256   PLT 132 (L) 06/23/2016 1551   MCV 86.1 06/23/2016 1249   MCH 29.7 06/23/2016 1249   MCHC 34.5 06/23/2016 1249   RDW 14.4 06/23/2016 1249   LYMPHSABS 4.8 (H) 04/20/2016 1547   MONOABS 0.7 04/20/2016 1547   EOSABS 0.6 04/20/2016 1547   BASOSABS 0.1 04/20/2016 1547     BMET    Component Value Date/Time   NA 139 06/23/2016 1256   K 3.9 06/23/2016 1256   CL 99 (L) 06/23/2016 1135   CO2 24 06/21/2016 1252   GLUCOSE 125 (H) 06/23/2016 1256   BUN 10 06/23/2016 1135   CREATININE 1.10 06/23/2016 1135   CALCIUM 8.9 06/21/2016 1252   GFRNONAA 59 (L) 06/21/2016 1252   GFRAA >60 06/21/2016 1252     A/P:  Stable postop course. Continue current plans. Wean vent as tolerated.

## 2016-06-23 NOTE — Progress Notes (Signed)
Dr. Prescott Gum aware of Chest tube drainage.  Orders received

## 2016-06-23 NOTE — Transfer of Care (Signed)
Immediate Anesthesia Transfer of Care Note  Patient: Suzan Garibaldi  Procedure(s) Performed: Procedure(s): AORTIC VALVE REPLACEMENT (AVR) (N/A) TRANSESOPHAGEAL ECHOCARDIOGRAM (TEE) (N/A)  Patient Location: SICU  Anesthesia Type:General  Level of Consciousness: Patient remains intubated per anesthesia plan  Airway & Oxygen Therapy: Patient remains intubated per anesthesia plan and Patient placed on Ventilator (see vital sign flow sheet for setting)  Post-op Assessment: Report given to RN and Post -op Vital signs reviewed and stable  Post vital signs: Reviewed and stable  Last Vitals:  Vitals:   06/23/16 0605 06/23/16 0610  BP: 122/67   Pulse: 74   Resp: 18   Temp:  36.7 C    Last Pain:  Vitals:   06/23/16 0610  TempSrc: Oral  PainSc:       Patients Stated Pain Goal: 3 (71/69/[id number])  Complications: No apparent anesthesia complications

## 2016-06-23 NOTE — Anesthesia Procedure Notes (Signed)
Procedure Name: Intubation Date/Time: 06/23/2016 7:52 AM Performed by: Izora Gala Pre-anesthesia Checklist: Patient identified, Emergency Drugs available, Suction available and Patient being monitored Patient Re-evaluated:Patient Re-evaluated prior to inductionOxygen Delivery Method: Circle system utilized Preoxygenation: Pre-oxygenation with 100% oxygen Intubation Type: IV induction Ventilation: Mask ventilation without difficulty Laryngoscope Size: Miller and 3 Grade View: Grade II Tube type: Oral Tube size: 7.5 mm Number of attempts: 1 Airway Equipment and Method: Stylet Placement Confirmation: ETT inserted through vocal cords under direct vision,  positive ETCO2 and breath sounds checked- equal and bilateral Secured at: 22 cm Tube secured with: Tape Dental Injury: Teeth and Oropharynx as per pre-operative assessment

## 2016-06-23 NOTE — Anesthesia Procedure Notes (Signed)
Central Venous Catheter Insertion Performed by: Myrtie Soman, anesthesiologist Start/End4/19/2018 7:24 AM, 06/23/2016 7:24 AM Patient location: Pre-op. Preanesthetic checklist: patient identified, IV checked, site marked, risks and benefits discussed, surgical consent, monitors and equipment checked, pre-op evaluation, timeout performed and anesthesia consent Position: Trendelenburg Lidocaine 1% used for infiltration Hand hygiene performed  and maximum sterile barriers used  Catheter size: 8.5 Fr PA cath was placed.Sheath introducer Swan type:thermodilution PA Cath depth:50 Procedure performed using ultrasound guided technique. Ultrasound Notes:anatomy identified, needle tip was noted to be adjacent to the nerve/plexus identified, no ultrasound evidence of intravascular and/or intraneural injection and image(s) printed for medical record Attempts: 1 Following insertion, line sutured, dressing applied and Biopatch. Post procedure assessment: blood return through all ports, free fluid flow and no air  Patient tolerated the procedure well with no immediate complications.

## 2016-06-23 NOTE — Progress Notes (Signed)
Dr. Prescott Gum aware of CT drainage.  Order received for FFP/Platelets.  Order to also not to decrease Precedex at this time.  Precedex taken back to 0.7 at this time.

## 2016-06-23 NOTE — Progress Notes (Signed)
The patient was examined and preop studies reviewed. There has been no change from the prior exam and the patient is ready for surgery.  plan AVR on [address]

## 2016-06-23 NOTE — Progress Notes (Signed)
Dr. Prescott Gum spoke with regarding lab results, vital signs, and pt. Orders received to not wean vent tonight. Pt to wean in AM after shift change. Will continue to monitor closely. Eleonore Chiquito RN 2 Heart

## 2016-06-24 ENCOUNTER — Inpatient Hospital Stay (HOSPITAL_COMMUNITY): Payer: Medicare Other

## 2016-06-24 ENCOUNTER — Encounter (HOSPITAL_COMMUNITY): Payer: Self-pay | Admitting: Cardiothoracic Surgery

## 2016-06-24 LAB — BASIC METABOLIC PANEL
Anion gap: 8 (ref 5–15)
BUN: 13 mg/dL (ref 6–20)
CALCIUM: 7.7 mg/dL — AB (ref 8.9–10.3)
CO2: 22 mmol/L (ref 22–32)
CREATININE: 1.18 mg/dL (ref 0.61–1.24)
Chloride: 106 mmol/L (ref 101–111)
GFR calc Af Amer: >60 mL/min (ref >60)
GFR calc non Af Amer: >60 mL/min (ref >60)
GLUCOSE: 111 mg/dL — AB (ref 65–99)
Potassium: 4.2 mmol/L (ref 3.5–5.1)
Sodium: 136 mmol/L (ref 135–145)

## 2016-06-24 LAB — PREPARE CRYOPRECIPITATE
Unit division: 0
Unit division: 0

## 2016-06-24 LAB — BPAM FFP
Blood Product Expiration Date: 201804242359
Blood Product Expiration Date: 201804242359
Blood Product Expiration Date: 201804242359
ISSUE DATE / TIME: 201804191103
ISSUE DATE / TIME: 201804191103
ISSUE DATE / TIME: 201804191426
Unit Type and Rh: 6200
Unit Type and Rh: 6200
Unit Type and Rh: 600

## 2016-06-24 LAB — CREATININE, SERUM
CREATININE: 1.6 mg/dL — AB (ref 0.61–1.24)
GFR calc Af Amer: 53 mL/min — ABNORMAL LOW (ref >60)
GFR calc non Af Amer: 46 mL/min — ABNORMAL LOW (ref >60)

## 2016-06-24 LAB — POCT I-STAT 3, ART BLOOD GAS (G3+)
Acid-base deficit: 2 mmol/L (ref 0.0–2.0)
Acid-base deficit: 3 mmol/L — ABNORMAL HIGH (ref 0.0–2.0)
Acid-base deficit: 2 mmol/L (ref 0.0–2.0)
Bicarbonate: 21.7 mmol/L (ref 20.0–28.0)
Bicarbonate: 20.5 mmol/L (ref 20.0–28.0)
Bicarbonate: 21.7 mmol/L (ref 20.0–28.0)
O2 Saturation: 96 %
O2 Saturation: 92 %
O2 Saturation: 96 %
Patient temperature: 38.2
Patient temperature: 37.9
Patient temperature: 37.6
TCO2: 23 mmol/L (ref 0–100)
TCO2: 21 mmol/L (ref 0–100)
TCO2: 23 mmol/L (ref 0–100)
pCO2 arterial: 33.4 mmHg (ref 32.0–48.0)
pCO2 arterial: 31.8 mmHg — ABNORMAL LOW (ref 32.0–48.0)
pCO2 arterial: 33.7 mmHg (ref 32.0–48.0)
pH, Arterial: 7.425 (ref 7.350–7.450)
pH, Arterial: 7.421 (ref 7.350–7.450)
pH, Arterial: 7.419 (ref 7.350–7.450)
pO2, Arterial: 82 mmHg — ABNORMAL LOW (ref 83.0–108.0)
pO2, Arterial: 66 mmHg — ABNORMAL LOW (ref 83.0–108.0)
pO2, Arterial: 82 mmHg — ABNORMAL LOW (ref 83.0–108.0)

## 2016-06-24 LAB — GLUCOSE, CAPILLARY
Glucose-Capillary: 108 mg/dL — ABNORMAL HIGH (ref 65–99)
Glucose-Capillary: 110 mg/dL — ABNORMAL HIGH (ref 65–99)
Glucose-Capillary: 112 mg/dL — ABNORMAL HIGH (ref 65–99)
Glucose-Capillary: 113 mg/dL — ABNORMAL HIGH (ref 65–99)
Glucose-Capillary: 113 mg/dL — ABNORMAL HIGH (ref 65–99)
Glucose-Capillary: 119 mg/dL — ABNORMAL HIGH (ref 65–99)
Glucose-Capillary: 122 mg/dL — ABNORMAL HIGH (ref 65–99)
Glucose-Capillary: 136 mg/dL — ABNORMAL HIGH (ref 65–99)
Glucose-Capillary: 142 mg/dL — ABNORMAL HIGH (ref 65–99)
Glucose-Capillary: 150 mg/dL — ABNORMAL HIGH (ref 65–99)
Glucose-Capillary: 97 mg/dL (ref 65–99)

## 2016-06-24 LAB — PREPARE FRESH FROZEN PLASMA
UNIT DIVISION: 0
Unit division: 0
Unit division: 0

## 2016-06-24 LAB — BPAM PLATELET PHERESIS
Blood Product Expiration Date: 201804212359
ISSUE DATE / TIME: 201804191426
Unit Type and Rh: 8400

## 2016-06-24 LAB — BPAM CRYOPRECIPITATE
Blood Product Expiration Date: 201804192140
Blood Product Expiration Date: 201804192140
ISSUE DATE / TIME: 201804191613
ISSUE DATE / TIME: 201804191613
Unit Type and Rh: 6200
Unit Type and Rh: 6200

## 2016-06-24 LAB — CBC
HEMATOCRIT: 24.1 % — AB (ref 39.0–52.0)
HEMATOCRIT: 24.2 % — AB (ref 39.0–52.0)
HEMOGLOBIN: 8.2 g/dL — AB (ref 13.0–17.0)
Hemoglobin: 8.2 g/dL — ABNORMAL LOW (ref 13.0–17.0)
MCH: 28.6 pg (ref 26.0–34.0)
MCH: 28.7 pg (ref 26.0–34.0)
MCHC: 34 g/dL (ref 30.0–36.0)
MCHC: 33.9 g/dL (ref 30.0–36.0)
MCV: 84 fL (ref 78.0–100.0)
MCV: 84.6 fL (ref 78.0–100.0)
PLATELETS: 135 10*3/uL — AB (ref 150–400)
Platelets: 126 10*3/uL — ABNORMAL LOW (ref 150–400)
RBC: 2.87 MIL/uL — ABNORMAL LOW (ref 4.22–5.81)
RBC: 2.86 MIL/uL — ABNORMAL LOW (ref 4.22–5.81)
RDW: 16.1 % — AB (ref 11.5–15.5)
RDW: 16.5 % — ABNORMAL HIGH (ref 11.5–15.5)
WBC: 9.3 10*3/uL (ref 4.0–10.5)
WBC: 11.4 10*3/uL — ABNORMAL HIGH (ref 4.0–10.5)

## 2016-06-24 LAB — POCT I-STAT, CHEM 8
BUN: 21 mg/dL — ABNORMAL HIGH (ref 6–20)
Calcium, Ion: 1.09 mmol/L — ABNORMAL LOW (ref 1.15–1.40)
Chloride: 103 mmol/L (ref 101–111)
Creatinine, Ser: 1.6 mg/dL — ABNORMAL HIGH (ref 0.61–1.24)
Glucose, Bld: 154 mg/dL — ABNORMAL HIGH (ref 65–99)
HCT: 24 % — ABNORMAL LOW (ref 39.0–52.0)
Hemoglobin: 8.2 g/dL — ABNORMAL LOW (ref 13.0–17.0)
Potassium: 4 mmol/L (ref 3.5–5.1)
Sodium: 137 mmol/L (ref 135–145)
TCO2: 25 mmol/L (ref 0–100)

## 2016-06-24 LAB — PREPARE PLATELET PHERESIS: Unit division: 0

## 2016-06-24 LAB — MAGNESIUM
Magnesium: 2.9 mg/dL — ABNORMAL HIGH (ref 1.7–2.4)
Magnesium: 2.7 mg/dL — ABNORMAL HIGH (ref 1.7–2.4)

## 2016-06-24 LAB — PREPARE RBC (CROSSMATCH)

## 2016-06-24 MED ORDER — LISINOPRIL 5 MG PO TABS
5.0000 mg | ORAL_TABLET | Freq: Every day | ORAL | Status: DC
  Administered 2016-06-24: 5 mg via ORAL
  Filled 2016-06-24: qty 1

## 2016-06-24 MED ORDER — FENTANYL CITRATE (PF) 100 MCG/2ML IJ SOLN
INTRAMUSCULAR | Status: AC
  Filled 2016-06-24: qty 2

## 2016-06-24 MED ORDER — DICLOFENAC SODIUM 50 MG PO TBEC: 50.0000 mg | DELAYED_RELEASE_TABLET | Freq: Two times a day (BID) | ORAL | Status: DC

## 2016-06-24 MED ORDER — FUROSEMIDE 10 MG/ML IJ SOLN
20.0000 mg | Freq: Two times a day (BID) | INTRAMUSCULAR | Status: DC
  Administered 2016-06-24 – 2016-06-26 (×5): 20 mg via INTRAVENOUS
  Filled 2016-06-24 (×4): qty 2

## 2016-06-24 MED ORDER — KETOROLAC TROMETHAMINE 15 MG/ML IJ SOLN
15.0000 mg | Freq: Four times a day (QID) | INTRAMUSCULAR | Status: AC
  Administered 2016-06-24 (×3): 15 mg via INTRAVENOUS
  Filled 2016-06-24 (×3): qty 1

## 2016-06-24 MED ORDER — ORAL CARE MOUTH RINSE
15.0000 mL | Freq: Two times a day (BID) | OROMUCOSAL | Status: DC
  Administered 2016-06-24 – 2016-06-25 (×3): 15 mL via OROMUCOSAL

## 2016-06-24 MED ORDER — INSULIN ASPART 100 UNIT/ML ~~LOC~~ SOLN
0.0000 [IU] | SUBCUTANEOUS | Status: DC
  Administered 2016-06-24 – 2016-06-25 (×3): 2 [IU] via SUBCUTANEOUS

## 2016-06-24 MED ORDER — FUROSEMIDE 10 MG/ML IJ SOLN: 20.0000 mg | Freq: Two times a day (BID) | INTRAMUSCULAR | Status: DC

## 2016-06-24 MED ORDER — FENTANYL 50 MCG/HR TD PT72
100.0000 ug | MEDICATED_PATCH | TRANSDERMAL | Status: DC
  Administered 2016-06-24 – 2016-06-27 (×2): 100 ug via TRANSDERMAL
  Filled 2016-06-24: qty 4
  Filled 2016-06-24: qty 2

## 2016-06-24 MED ORDER — PNEUMOCOCCAL VAC POLYVALENT 25 MCG/0.5ML IJ INJ
0.5000 mL | INJECTION | INTRAMUSCULAR | Status: DC | PRN
Start: 2016-06-24 — End: 2016-06-28

## 2016-06-24 MED ORDER — FENTANYL CITRATE (PF) 100 MCG/2ML IJ SOLN
50.0000 ug | INTRAMUSCULAR | Status: DC | PRN
  Administered 2016-06-24: 50 ug via INTRAVENOUS

## 2016-06-24 MED ORDER — AMLODIPINE BESYLATE 5 MG PO TABS
5.0000 mg | ORAL_TABLET | Freq: Every day | ORAL | Status: DC
  Administered 2016-06-27 – 2016-06-28 (×2): 5 mg via ORAL
  Filled 2016-06-24 (×2): qty 1

## 2016-06-24 MED ORDER — HYDROCORTISONE NA SUCCINATE PF 100 MG IJ SOLR
100.0000 mg | Freq: Every day | INTRAMUSCULAR | Status: AC
  Administered 2016-06-24: 100 mg via INTRAVENOUS
  Filled 2016-06-24: qty 2

## 2016-06-24 MED ORDER — METOPROLOL TARTRATE 25 MG PO TABS
25.0000 mg | ORAL_TABLET | Freq: Two times a day (BID) | ORAL | Status: DC
  Administered 2016-06-24 – 2016-06-28 (×5): 25 mg via ORAL
  Filled 2016-06-24 (×5): qty 1

## 2016-06-24 MED ORDER — ACETAMINOPHEN 10 MG/ML IV SOLN
1000.0000 mg | Freq: Once | INTRAVENOUS | Status: AC
  Administered 2016-06-24: 1000 mg via INTRAVENOUS

## 2016-06-24 MED ORDER — INSULIN ASPART 100 UNIT/ML ~~LOC~~ SOLN: 0.0000 [IU] | SUBCUTANEOUS | Status: DC

## 2016-06-24 MED ORDER — HYDRALAZINE HCL 20 MG/ML IJ SOLN
10.0000 mg | Freq: Four times a day (QID) | INTRAMUSCULAR | Status: DC | PRN
  Administered 2016-06-24: 10 mg via INTRAVENOUS
  Filled 2016-06-24: qty 1

## 2016-06-24 MED FILL — Mannitol IV Soln 20%: INTRAVENOUS | Qty: 500 | Status: AC

## 2016-06-24 MED FILL — Sodium Bicarbonate IV Soln 8.4%: INTRAVENOUS | Qty: 50 | Status: AC

## 2016-06-24 MED FILL — Lidocaine HCl IV Inj 20 MG/ML: INTRAVENOUS | Qty: 10 | Status: AC

## 2016-06-24 MED FILL — Electrolyte-R (PH 7.4) Solution: INTRAVENOUS | Qty: 3000 | Status: AC

## 2016-06-24 MED FILL — Heparin Sodium (Porcine) Inj 1000 Unit/ML: INTRAMUSCULAR | Qty: 20 | Status: AC

## 2016-06-24 MED FILL — Sodium Chloride IV Soln 0.9%: INTRAVENOUS | Qty: 2000 | Status: AC

## 2016-06-24 NOTE — Progress Notes (Signed)
CT surgery p.m. Rounds  Stable postop day 1 and AVR Minimal chest tube drainage Up in chair for 2 hours Neuro intact P.m. labs with hemoglobin 8.2, potassium 4.0, creatinine up to 1.6

## 2016-06-24 NOTE — Procedures (Addendum)
Extubation Procedure Note  Patient Details:   Name: Carl Hunt DOB: 10/01/1955 MRN: [id number]   Airway Documentation:  Airway 7.5 mm (Active)  Secured at (cm) 22 cm 06/24/2016  8:20 AM  Measured From Lips 06/24/2016  8:20 AM  Secured Location Right 06/24/2016  8:20 AM  Secured By Pink Tape 06/24/2016  8:20 AM  Tube Holder Repositioned Yes 06/24/2016  3:41 AM  Cuff Pressure (cm H2O) 28 cm H2O 06/24/2016  3:41 AM  Site Condition Dry 06/24/2016  8:20 AM    Evaluation  O2 sats: stable throughout Complications: No apparent complications Patient did tolerate procedure well. Bilateral Breath Sounds: Clear   Yes   Patient extubated to 40% venturi mask per MD order. Will wean oxygen as tolerated. Vital signs stable at this time. Patient tolerating well. RT will continue to monitor.  NIF-35 VC 1.2L Ples, Trudel 06/24/2016, 8:58 AM

## 2016-06-24 NOTE — Plan of Care (Signed)
Problem: Activity: Goal: Risk for activity intolerance will decrease Outcome: Progressing Pt to be up OOB in am when extubated  Problem: Bowel/Gastric: Goal: Gastrointestinal status for postoperative course will improve Outcome: Not Progressing No active BS  Problem: Cardiac: Goal: Hemodynamic stability will improve Outcome: Progressing Within parameters on minimal gtt's  Goal: Ability to maintain an adequate cardiac output will improve Outcome: Progressing Within parameters Goal: Will show no signs and symptoms of excessive bleeding Outcome: Progressing Chest tube output within parameters now

## 2016-06-24 NOTE — Care Management Note (Addendum)
Case Management Note  Patient Details  Name: Carl Hunt MRN: [id number] Date of Birth: 1955-07-18  Subjective/Objective:   POD 1 AVR, TEE, with chest tube.  Patient is from home alone in [place], he states he has support from family members in [place], and at discharge a family member will be staying with him. Await pt eval.                  Action/Plan: NCM will follow patient progression for dc needs.  Expected Discharge Date:                  Expected Discharge Plan:     In-House Referral:     Discharge planning Services  CM Consult  Post Acute Care Choice:    Choice offered to:     DME Arranged:    DME Agency:     HH Arranged:    HH Agency:     Status of Service:  In process, will continue to follow  If discussed at Long Length of Stay Meetings, dates discussed:    Additional Comments:  Carl Mayo, RN 06/24/2016, 2:27 PM

## 2016-06-24 NOTE — Op Note (Signed)
NAME:  Carl Hunt                 ACCOUNT NO.:  MEDICAL RECORD NO.:  LOCATION:                                 FACILITY:  PHYSICIAN:  Ivin Poot, M.D.       DATE OF BIRTH:  DATE OF PROCEDURE:  06/23/2016 DATE OF DISCHARGE:                              OPERATIVE REPORT   OPERATION:  Aortic valve replacement with a 21-mm Edwards pericardial tissue valve (model [id number], serial [id number]).  SURGEON:  Ivin Poot, M.D.  ASSISTANT:  John Giovanni, PA-C.  PREOPERATIVE DIAGNOSIS:  Severe aortic stenosis, moderate aortic insufficiency.  POSTOPERATIVE DIAGNOSIS:  Severe aortic stenosis, moderate aortic insufficiency.  ANESTHESIA:  General.  CLINICAL NOTE:  The patient is a 61 year old Caucasian male smoker, who is diagnosed with aortic stenosis when he presented for surgery for hip replacement.  He underwent Cardiology evaluation by Dr. Eliezer Mccoy, [place].  Echo showed bicuspid stenotic calcified aortic stenosis with left ventricular hypertrophy.  Coronary arteriogram showed normal coronaries.  His mean gradient was 60 mmHg.  He was recommended to have aortic valve replacement prior to undergoing hip replacement. He was in considerable pain from his hip and unable to walk and taking narcotics, so he wished to proceed with the aortic valve replacement as soon as possible.  Although the patient was an active smoker, I felt that he was an acceptable candidate for surgical AVR.  I discussed indications, benefits, and risks of aortic valve replacement for treatment of his severe aortic stenosis.  We discussed the plan to use a bioprosthetic valve, which was the patient's preference in order to avoid lifelong commitment to Coumadin anticoagulation.  I discussed the risks of the surgery to the patient including risks of MI, bleeding, blood transfusion requirement, infection, postoperative pulmonary problems, postoperative heart block requiring pacemaker, and  death.  He demonstrated his understanding and agreed to proceed with surgery and what I felt was an informed consent.  OPERATIVE FINDINGS: 1. The ascending aorta was mildly dilated - by CT scan measured 4.2 cm     in diameter.  It was somewhat thinned. 2. Calcified bicuspid aortic valve with fusion of the left and right     cusps. 3. Successful placement with a 21-mm bioprosthetic valve with good     valvular function, LV function after separation from     cardiopulmonary bypass. 4. Postoperative coagulopathy after reversal of heparin with     protamine, requiring FFP and platelet transfusion.  OPERATIVE PROCEDURE:  The patient was brought to the operating room, placed supine on the operating table.  General anesthesia was induced after invasive hemodynamic monitoring.  The transesophageal echo probe was placed by the anesthesiologist, which confirmed the preoperative diagnosis.  The patient was prepped and draped as a sterile field.  A proper time-out was performed.  A sternal incision was made.  The sternotomy was completed and the retractor placed.  The pericardium was opened and suspended.  Heparin was administered.  Pursestrings were placed to the ascending aorta and right atrium.  The patient was cannulated and placed on cardiopulmonary bypass and a vent was placed via the right superior pulmonary vein.  Cardioplegic cannulas were  placed both antegrade and retrograde cold blood cardioplegia.  The patient was cooled to 32 degrees.  The aortic crossclamp was applied. One liter of cold blood cardioplegia was delivered in split doses between the antegrade aortic and retrograde coronary sinus catheters. There was good cardioplegic arrest and septal temperature dropped less than 12 degrees.  Cardioplegia was delivered every 20 minutes.  A transverse aortotomy was performed.  Aortic valve was inspected.  It had bicuspid calcified pathology.  It was excised.  The annulus  was debrided.  The outflow tract was irrigated with copious amounts of cold saline.  The annulus was sized to a 21 mm valve.  Subannular 2-0 pledgeted Ethibond sutures were placed around the valve circumference numbering 14 total.  The valve was prepared according to protocol.  The sutures were placed through the sewing ring of the valve, and the valve was seated and sutures were tied.  The valve was inspected and conformed to the annulus well and/or no spaces for valvular leak.  The coronary ostia were patent.  The aortotomy was closed with interrupted 4-0 pledgeted Prolene sutures due to its thin nature and then reinforced with a running 4-0 Prolene for hemostasis.  Air was vented from the coronaries with a dose of retrograde warm blood cardioplegia and the usual de-airing maneuvers on bypass and the crossclamp was removed.  The heart was cardioverted back to a regular rhythm.  There was adequate hemostasis at the aortotomy.  Temporary pacing wires were applied.  The patient was rewarmed and reperfused.  The lungs were expanded. Ventilator was resumed.  The patient was then weaned off cardiopulmonary bypass on low-dose milrinone.  There were good LV function and good valvular function.  Protamine was administered without adverse reaction. There was still considerable coagulopathy.  The patient then received FFP and desmopressin.  An anterior mediastinal chest tube was placed and brought out through separate incisions.  The superior pericardial fat was closed over the aorta.  The sternum was closed with interrupted steel wire.  The pectoralis fascia was closed with running #1 Vicryl. The subcutaneous and skin layers were closed in running Vicryl and sterile dressings were applied.  Total cardiopulmonary bypass time was 107 minutes.     Ivin Poot, M.D.     PV/MEDQ  D:  06/23/2016  T:  06/24/2016  Job:  [id number]

## 2016-06-24 NOTE — Progress Notes (Signed)
1 Day Post-Op Procedure(s) (LRB): AORTIC VALVE REPLACEMENT (AVR) (N/A) TRANSESOPHAGEAL ECHOCARDIOGRAM (TEE) (N/A) Subjective: Remained intubated overnite due to posyop bleeding Responsive on vent CXR clear Postop expected blood loss anemia Febrile due to inflammatory response A- paced, in nsr Chest tube drainage down to 30 cc/hr Objective: Vital signs in last 24 hours: Temp:  [97.9 F (36.6 C)-101.3 F (38.5 C)] 100.8 F (38.2 C) (04/20 0800) Pulse Rate:  [80-90] 80 (04/20 0800) Cardiac Rhythm: Atrial paced (04/20 0800) Resp:  [9-22] 18 (04/20 0800) BP: (88-132)/(53-90) 114/73 (04/20 0800) SpO2:  [94 %-100 %] 95 % (04/20 0800) Arterial Line BP: (84-151)/(46-78) 120/63 (04/20 0800) FiO2 (%):  [40 %-60 %] 40 % (04/20 0820) Weight:  [184 lb 15.5 oz (83.9 kg)] 184 lb 15.5 oz (83.9 kg) (04/20 0500)  Hemodynamic parameters for last 24 hours: PAP: (18-41)/(7-29) 31/17 CO:  [3.8 L/min-6.7 L/min] 5.6 L/min CI:  [2 L/min/m2-3.5 L/min/m2] 2.9 L/min/m2  Intake/Output from previous day: 04/19 0701 - 04/20 0700 In: 7595.1 [I.V.:3192.1; QZESP:2330; NG/GT:30; IV Piggyback:1915] Out: 0762 [Urine:3170; Emesis/NG output:50; Chest Tube:1240] Intake/Output this shift: Total I/O In: 444.5 [I.V.:144.5; IV Piggyback:300] Out: -        Exam    General- alert and comfortable   Lungs- clear without rales, wheezes   Cor- regular rate and rhythm, no murmur , gallop   Abdomen- soft, non-tender   Extremities - warm, non-tender, minimal edema   Neuro- oriented, appropriate, no focal weakness   Lab Results:  Recent Labs  06/23/16 1837 06/24/16 0419  WBC 10.9* 9.3  HGB 8.8* 8.2*  HCT 25.3* 24.1*  PLT 122* 135*   BMET:  Recent Labs  06/21/16 1252  06/23/16 1819 06/23/16 1837 06/24/16 0419  NA 136  < > 138  --  136  K 3.8  < > 4.3  --  4.2  CL 101  < > 104  --  106  CO2 24  --   --   --  22  GLUCOSE 124*  < > 138*  --  111*  BUN 12  < > 11  --  13  CREATININE 1.29*  < > 1.10  1.19 1.18  CALCIUM 8.9  --   --   --  7.7*  < > = values in this interval not displayed.  PT/INR:  Recent Labs  06/23/16 1551  LABPROT 17.1*  INR 1.38   ABG    Component Value Date/Time   PHART 7.425 06/24/2016 0409   HCO3 21.7 06/24/2016 0409   TCO2 23 06/24/2016 0409   ACIDBASEDEF 2.0 06/24/2016 0409   O2SAT 96.0 06/24/2016 0409   CBG (last 3)   Recent Labs  06/24/16 0309 06/24/16 0408 06/24/16 0802  GLUCAP 110* 113* 113*    Assessment/Plan: S/P Procedure(s) (LRB): AORTIC VALVE REPLACEMENT (AVR) (N/A) TRANSESOPHAGEAL ECHOCARDIOGRAM (TEE) (N/A) Mobilize Diuresis Diabetes control See progression orders   LOS: 1 day    Tharon Aquas Trigt III 06/24/2016

## 2016-06-25 ENCOUNTER — Inpatient Hospital Stay (HOSPITAL_COMMUNITY): Payer: Medicare Other

## 2016-06-25 LAB — COMPREHENSIVE METABOLIC PANEL
ALT: 14 U/L — ABNORMAL LOW (ref 17–63)
AST: 30 U/L (ref 15–41)
Albumin: 2.8 g/dL — ABNORMAL LOW (ref 3.5–5.0)
Alkaline Phosphatase: 36 U/L — ABNORMAL LOW (ref 38–126)
Anion gap: 8 (ref 5–15)
BUN: 25 mg/dL — ABNORMAL HIGH (ref 6–20)
CO2: 25 mmol/L (ref 22–32)
Calcium: 7.8 mg/dL — ABNORMAL LOW (ref 8.9–10.3)
Chloride: 102 mmol/L (ref 101–111)
Creatinine, Ser: 1.5 mg/dL — ABNORMAL HIGH (ref 0.61–1.24)
GFR calc Af Amer: 57 mL/min — ABNORMAL LOW (ref >60)
GFR calc non Af Amer: 49 mL/min — ABNORMAL LOW (ref >60)
Glucose, Bld: 102 mg/dL — ABNORMAL HIGH (ref 65–99)
Potassium: 3.7 mmol/L (ref 3.5–5.1)
Sodium: 135 mmol/L (ref 135–145)
Total Bilirubin: 0.6 mg/dL (ref 0.3–1.2)
Total Protein: 5.4 g/dL — ABNORMAL LOW (ref 6.5–8.1)

## 2016-06-25 LAB — CBC
HCT: 22.6 % — ABNORMAL LOW (ref 39.0–52.0)
Hemoglobin: 7.5 g/dL — ABNORMAL LOW (ref 13.0–17.0)
MCH: 28.6 pg (ref 26.0–34.0)
MCHC: 33.2 g/dL (ref 30.0–36.0)
MCV: 86.3 fL (ref 78.0–100.0)
Platelets: 125 10*3/uL — ABNORMAL LOW (ref 150–400)
RBC: 2.62 MIL/uL — ABNORMAL LOW (ref 4.22–5.81)
RDW: 17 % — ABNORMAL HIGH (ref 11.5–15.5)
WBC: 13.2 10*3/uL — ABNORMAL HIGH (ref 4.0–10.5)

## 2016-06-25 LAB — GLUCOSE, CAPILLARY
Glucose-Capillary: 103 mg/dL — ABNORMAL HIGH (ref 65–99)
Glucose-Capillary: 120 mg/dL — ABNORMAL HIGH (ref 65–99)
Glucose-Capillary: 88 mg/dL (ref 65–99)
Glucose-Capillary: 91 mg/dL (ref 65–99)
Glucose-Capillary: 97 mg/dL (ref 65–99)

## 2016-06-25 LAB — POCT I-STAT, CHEM 8
BUN: 26 mg/dL — ABNORMAL HIGH (ref 6–20)
Calcium, Ion: 1.14 mmol/L — ABNORMAL LOW (ref 1.15–1.40)
Chloride: 102 mmol/L (ref 101–111)
Creatinine, Ser: 1.4 mg/dL — ABNORMAL HIGH (ref 0.61–1.24)
Glucose, Bld: 100 mg/dL — ABNORMAL HIGH (ref 65–99)
HCT: 25 % — ABNORMAL LOW (ref 39.0–52.0)
Hemoglobin: 8.5 g/dL — ABNORMAL LOW (ref 13.0–17.0)
Potassium: 3.9 mmol/L (ref 3.5–5.1)
Sodium: 136 mmol/L (ref 135–145)
TCO2: 24 mmol/L (ref 0–100)

## 2016-06-25 LAB — PREPARE RBC (CROSSMATCH)

## 2016-06-25 MED ORDER — FENTANYL CITRATE (PF) 100 MCG/2ML IJ SOLN: 50.0000 ug | INTRAMUSCULAR | Status: DC | PRN

## 2016-06-25 MED ORDER — POTASSIUM CHLORIDE 20 MEQ/15ML (10%) PO SOLN: 20.0000 meq | Freq: Four times a day (QID) | ORAL | Status: AC

## 2016-06-25 MED ORDER — POTASSIUM CHLORIDE CRYS ER 20 MEQ PO TBCR
20.0000 meq | EXTENDED_RELEASE_TABLET | Freq: Four times a day (QID) | ORAL | Status: AC
  Administered 2016-06-25 (×2): 20 meq via ORAL
  Filled 2016-06-25 (×2): qty 1

## 2016-06-25 NOTE — Progress Notes (Signed)
Patient examined and record reviewed.Hemodynamics   stable,labs satisfactory.Patient had stable day.Continue current care. Tharon Aquas Trigt III 06/25/2016

## 2016-06-25 NOTE — Plan of Care (Signed)
Problem: Activity: Goal: Risk for activity intolerance will decrease Outcome: Progressing Pt to walk x3 today.  Problem: Bowel/Gastric: Goal: Gastrointestinal status for postoperative course will improve Outcome: Progressing Tolerating clears. Hypoactive BS. Passing flatus per pt  Problem: Cardiac: Goal: Hemodynamic stability will improve Outcome: Progressing Within parameters Goal: Ability to maintain an adequate cardiac output will improve Outcome: Progressing Within parameters

## 2016-06-25 NOTE — Progress Notes (Signed)
2 Days Post-Op Procedure(s) (LRB): AORTIC VALVE REPLACEMENT (AVR) (N/A) TRANSESOPHAGEAL ECHOCARDIOGRAM (TEE) (N/A) Subjective: Feels stronger with less pain nsr Blood loss anemia- intraop coagulopathy CXR clear 1 u PRBC for Hb 7g  Objective: Vital signs in last 24 hours: Temp:  [98.1 F (36.7 C)-98.6 F (37 C)] 98.1 F (36.7 C) (04/21 0800) Pulse Rate:  [62-102] 65 (04/21 0800) Cardiac Rhythm: Normal sinus rhythm (04/21 0800) Resp:  [9-19] 9 (04/21 0800) BP: (95-174)/(52-98) 106/73 (04/21 0800) SpO2:  [88 %-99 %] 99 % (04/21 0809) FiO2 (%):  [40 %] 40 % (04/20 0901) Weight:  [179 lb 9.6 oz (81.5 kg)] 179 lb 9.6 oz (81.5 kg) (04/21 0500)  Hemodynamic parameters for last 24 hours:   stable Intake/Output from previous day: 04/20 0701 - 04/21 0700 In: 1109.3 [I.V.:559.3; IV Piggyback:550] Out: 3845 [Urine:1015; Emesis/NG output:200; Chest Tube:210] Intake/Output this shift: No intake/output data recorded.       Exam    General- alert and comfortable   Lungs- clear without rales, wheezes   Cor- regular rate and rhythm, no murmur , gallop   Abdomen- soft, non-tender   Extremities - warm, non-tender, minimal edema   Neuro- oriented, appropriate, no focal weakness   Lab Results:  Recent Labs  06/24/16 1600 06/24/16 1606 06/25/16 0431  WBC 11.4*  --  13.2*  HGB 8.2* 8.2* 7.5*  HCT 24.2* 24.0* 22.6*  PLT 126*  --  125*   BMET:  Recent Labs  06/24/16 0419  06/24/16 1606 06/25/16 0432  NA 136  --  137 135  K 4.2  --  4.0 3.7  CL 106  --  103 102  CO2 22  --   --  25  GLUCOSE 111*  --  154* 102*  BUN 13  --  21* 25*  CREATININE 1.18  < > 1.60* 1.50*  CALCIUM 7.7*  --   --  7.8*  < > = values in this interval not displayed.  PT/INR:  Recent Labs  06/23/16 1551  LABPROT 17.1*  INR 1.38   ABG    Component Value Date/Time   PHART 7.419 06/24/2016 1001   HCO3 21.7 06/24/2016 1001   TCO2 25 06/24/2016 1606   ACIDBASEDEF 2.0 06/24/2016 1001   O2SAT  96.0 06/24/2016 1001   CBG (last 3)   Recent Labs  06/24/16 1946 06/24/16 2324 06/25/16 0401  GLUCAP 150* 108* 88    Assessment/Plan: S/P Procedure(s) (LRB): AORTIC VALVE REPLACEMENT (AVR) (N/A) TRANSESOPHAGEAL ECHOCARDIOGRAM (TEE) (N/A) Mobilize Diuresis Diabetes control transfuse for postop expected blood loss anemia   LOS: 2 days    Carl Hunt 06/25/2016

## 2016-06-25 NOTE — Evaluation (Signed)
Physical Therapy Evaluation Patient Details Name: Carl Hunt MRN: [id number] DOB: Apr 22, 1955 Today's Date: 06/25/2016   History of Present Illness  patient is a 61 yo male s/p AVR.   Clinical Impression  Patient demonstrates deficits in functional mobility as indicated below. Will need continued skilled PT to address deficits and maximize function. Will see as indicated and progress as tolerated.  At this time, patient states that he has good amount of family support to assist at home, states that he could use w/c at home if needed. Recommend HHPT and use of bilateral UE platform RW for compliance with precautions and LLE pain.     Follow Up Recommendations Home health PT;Supervision/Assistance - 24 hour    Equipment Recommendations  Rolling walker with 5" wheels (with bilateral PLATFORMs)    Recommendations for Other Services       Precautions / Restrictions Precautions Precautions: Fall;Sternal Restrictions Weight Bearing Restrictions:  (sternal precautions apply)      Mobility  Bed Mobility Overal bed mobility: Needs Assistance Bed Mobility: Sit to Supine       Sit to supine: Mod assist;+2 for physical assistance   General bed mobility comments: moderate assist to elevate LEs and control trunk upon transition to supine  Transfers Overall transfer level: Needs assistance Equipment used: Bilateral platform walker (eva walker) Transfers: Sit to/from Stand Sit to Stand: Min assist         General transfer comment: min assist to power up with cues for position, assist via chuck pad to elevate to standing  Ambulation/Gait Ambulation/Gait assistance: Min guard Ambulation Distance (Feet): 70 Feet Assistive device:  (eva walker) Gait Pattern/deviations: Step-through pattern;Decreased stride length;Antalgic;Trunk flexed Gait velocity: decreased Gait velocity interpretation: Below normal speed for age/gender General Gait Details: patient limited by pain in LLE  during ambulation, 2 standing rest breaks due to fatigue and dizziness.   Stairs            Wheelchair Mobility    Modified Rankin (Stroke Patients Only)       Balance Overall balance assessment: Needs assistance   Sitting balance-Leahy Scale: Fair Sitting balance - Comments: able to sit without assist, unable to take on perturbations outside of BOS without assist   Standing balance support: Bilateral upper extremity supported Standing balance-Leahy Scale: Poor Standing balance comment: EVA walker required to maintain upright position. pain noted in LLE during static standing                             Pertinent Vitals/Pain Pain Assessment: 0-10 Pain Score: 7  Pain Location: chest incision and left hip Pain Descriptors / Indicators: Grimacing;Guarding;Operative site guarding;Sharp Pain Intervention(s): Limited activity within patient's tolerance;Monitored during session    Home Living Family/patient expects to be discharged to:: Private residence Living Arrangements: Alone Available Help at Discharge: Family Type of Home: House Home Access: Stairs to enter Entrance Stairs-Rails: Can reach both Entrance Stairs-Number of Steps: 2 Home Layout: One level        Prior Function Level of Independence: Independent               Hand Dominance   Dominant Hand: Right    Extremity/Trunk Assessment   Upper Extremity Assessment Upper Extremity Assessment: Defer to OT evaluation    Lower Extremity Assessment Lower Extremity Assessment: LLE deficits/detail LLE Deficits / Details: severe OA of left hip joint, limits all aspects of LLE mobility LLE: Unable to fully assess due to pain  Communication   Communication: No difficulties  Cognition Arousal/Alertness: Awake/alert Behavior During Therapy: WFL for tasks assessed/performed Overall Cognitive Status: Within Functional Limits for tasks assessed                                         General Comments      Exercises     Assessment/Plan    PT Assessment Patient needs continued PT services  PT Problem List Decreased strength;Decreased activity tolerance;Decreased balance;Decreased mobility;Decreased knowledge of use of DME;Decreased safety awareness;Cardiopulmonary status limiting activity;Pain       PT Treatment Interventions DME instruction;Gait training;Stair training;Functional mobility training;Therapeutic activities;Therapeutic exercise;Balance training;Patient/family education    PT Goals (Current goals can be found in the Care Plan section)  Acute Rehab PT Goals Patient Stated Goal: to go home PT Goal Formulation: With patient Time For Goal Achievement: 07/09/16 Potential to Achieve Goals: Good    Frequency Min 3X/week   Barriers to discharge Decreased caregiver support      Co-evaluation               End of Session   Activity Tolerance: Patient limited by fatigue;Patient limited by pain Patient left: in bed;with call bell/phone within reach Nurse Communication: Mobility status PT Visit Diagnosis: Difficulty in walking, not elsewhere classified (R26.2);Pain Pain - Right/Left: Left Pain - part of body: Leg    Time: 6384-5364 PT Time Calculation (min) (ACUTE ONLY): 26 min   Charges:   PT Evaluation $PT Eval Moderate Complexity: 1 Procedure PT Treatments $Gait Training: 8-22 mins   PT G Codes:        Alben Deeds, PT DPT  [phone]   Duncan Dull 06/25/2016, 1:47 PM

## 2016-06-26 ENCOUNTER — Inpatient Hospital Stay (HOSPITAL_COMMUNITY): Payer: Medicare Other

## 2016-06-26 LAB — BASIC METABOLIC PANEL
Anion gap: 7 (ref 5–15)
BUN: 23 mg/dL — ABNORMAL HIGH (ref 6–20)
CO2: 25 mmol/L (ref 22–32)
Calcium: 7.9 mg/dL — ABNORMAL LOW (ref 8.9–10.3)
Chloride: 102 mmol/L (ref 101–111)
Creatinine, Ser: 1.35 mg/dL — ABNORMAL HIGH (ref 0.61–1.24)
GFR calc Af Amer: >60 mL/min (ref >60)
GFR calc non Af Amer: 56 mL/min — ABNORMAL LOW (ref >60)
Glucose, Bld: 94 mg/dL (ref 65–99)
Potassium: 4.8 mmol/L (ref 3.5–5.1)
Sodium: 134 mmol/L — ABNORMAL LOW (ref 135–145)

## 2016-06-26 LAB — CBC
HCT: 26.3 % — ABNORMAL LOW (ref 39.0–52.0)
Hemoglobin: 8.8 g/dL — ABNORMAL LOW (ref 13.0–17.0)
MCH: 29.7 pg (ref 26.0–34.0)
MCHC: 33.5 g/dL (ref 30.0–36.0)
MCV: 88.9 fL (ref 78.0–100.0)
Platelets: 128 10*3/uL — ABNORMAL LOW (ref 150–400)
RBC: 2.96 MIL/uL — ABNORMAL LOW (ref 4.22–5.81)
RDW: 16.3 % — ABNORMAL HIGH (ref 11.5–15.5)
WBC: 12.4 10*3/uL — ABNORMAL HIGH (ref 4.0–10.5)

## 2016-06-26 LAB — GLUCOSE, CAPILLARY: Glucose-Capillary: 88 mg/dL (ref 65–99)

## 2016-06-26 MED ORDER — FUROSEMIDE 40 MG PO TABS
40.0000 mg | ORAL_TABLET | Freq: Every day | ORAL | Status: DC
Start: 2016-06-27 — End: 2016-06-28
  Administered 2016-06-27 – 2016-06-28 (×2): 40 mg via ORAL
  Filled 2016-06-26 (×2): qty 1

## 2016-06-26 MED ORDER — FUROSEMIDE 10 MG/ML IJ SOLN
INTRAMUSCULAR | Status: AC
  Filled 2016-06-26: qty 2

## 2016-06-26 MED ORDER — SODIUM CHLORIDE 0.9 % IV SOLN: 250.0000 mL | INTRAVENOUS | Status: DC | PRN

## 2016-06-26 MED ORDER — GUAIFENESIN ER 600 MG PO TB12
600.0000 mg | ORAL_TABLET | Freq: Two times a day (BID) | ORAL | Status: DC
  Administered 2016-06-26 – 2016-06-28 (×5): 600 mg via ORAL
  Filled 2016-06-26 (×5): qty 1

## 2016-06-26 MED ORDER — FENTANYL CITRATE (PF) 100 MCG/2ML IJ SOLN: 50.0000 ug | INTRAMUSCULAR | Status: AC | PRN

## 2016-06-26 MED ORDER — MAGNESIUM HYDROXIDE 400 MG/5ML PO SUSP: 30.0000 mL | Freq: Every day | ORAL | Status: DC

## 2016-06-26 MED ORDER — SODIUM CHLORIDE 0.9% FLUSH: 3.0000 mL | INTRAVENOUS | Status: DC | PRN

## 2016-06-26 MED ORDER — SODIUM CHLORIDE 0.9% FLUSH
3.0000 mL | Freq: Two times a day (BID) | INTRAVENOUS | Status: DC
  Administered 2016-06-26 – 2016-06-28 (×3): 3 mL via INTRAVENOUS

## 2016-06-26 MED ORDER — MOVING RIGHT ALONG BOOK
Freq: Once | Status: AC
  Administered 2016-06-26: 10:00:00
  Filled 2016-06-26: qty 1

## 2016-06-26 NOTE — Progress Notes (Signed)
Pt received from [place]. Pt oriented to room and equipment. Telemetry applied, CCMD notified x2. Family at bedside. Denies needs, call bell within reach, will continue to monitor.   Fritz Pickerel, RN

## 2016-06-26 NOTE — Progress Notes (Signed)
3 Days Post-Op Procedure(s) (LRB): AORTIC VALVE REPLACEMENT (AVR) (N/A) TRANSESOPHAGEAL ECHOCARDIOGRAM (TEE) (N/A) Subjective: Doing well  nsr eady for stepdown  Objective: Vital signs in last 24 hours: Temp:  [97.6 F (36.4 C)-98.9 F (37.2 C)] 98.2 F (36.8 C) (04/22 0838) Pulse Rate:  [61-79] 75 (04/22 0800) Cardiac Rhythm: Normal sinus rhythm (04/22 0800) Resp:  [8-19] 10 (04/22 0800) BP: (92-146)/(66-84) 116/76 (04/22 0800) SpO2:  [88 %-98 %] 91 % (04/22 0800) Weight:  [178 lb 8 oz (81 kg)] 178 lb 8 oz (81 kg) (04/22 0500)  Hemodynamic parameters for last 24 hours:  stable  Intake/Output from previous day: 04/21 0701 - 04/22 0700 In: 2665 [P.O.:1300; I.V.:1030; Blood:335] Out: 1880 [YFVCB:4496; Chest Tube:30] Intake/Output this shift: No intake/output data recorded.       Exam    General- alert and comfortable   Lungs- clear without rales, wheezes   Cor- regular rate and rhythm, no murmur , gallop   Abdomen- soft, non-tender   Extremities - warm, non-tender, minimal edema   Neuro- oriented, appropriate, no focal weakness   Lab Results:  Recent Labs  06/25/16 0431 06/25/16 1557 06/26/16 0254  WBC 13.2*  --  12.4*  HGB 7.5* 8.5* 8.8*  HCT 22.6* 25.0* 26.3*  PLT 125*  --  128*   BMET:  Recent Labs  06/25/16 0432 06/25/16 1557 06/26/16 0254  NA 135 136 134*  K 3.7 3.9 4.8  CL 102 102 102  CO2 25  --  25  GLUCOSE 102* 100* 94  BUN 25* 26* 23*  CREATININE 1.50* 1.40* 1.35*  CALCIUM 7.8*  --  7.9*    PT/INR:  Recent Labs  06/23/16 1551  LABPROT 17.1*  INR 1.38   ABG    Component Value Date/Time   PHART 7.419 06/24/2016 1001   HCO3 21.7 06/24/2016 1001   TCO2 24 06/25/2016 1557   ACIDBASEDEF 2.0 06/24/2016 1001   O2SAT 96.0 06/24/2016 1001   CBG (last 3)   Recent Labs  06/25/16 1946 06/25/16 2336 06/26/16 0833  GLUCAP 120* 91 88    Assessment/Plan: S/P Procedure(s) (LRB): AORTIC VALVE REPLACEMENT (AVR)  (N/A) TRANSESOPHAGEAL ECHOCARDIOGRAM (TEE) (N/A) Mobilize Diuresis d/c tubes/lines Plan for transfer to step-down: see transfer orders   LOS: 3 days    Tharon Aquas Trigt III 06/26/2016

## 2016-06-27 ENCOUNTER — Inpatient Hospital Stay (HOSPITAL_COMMUNITY): Payer: Medicare Other

## 2016-06-27 LAB — TYPE AND SCREEN
ABO/RH(D): A NEG
Antibody Screen: NEGATIVE
Unit division: 0
Unit division: 0
Unit division: 0
Unit division: 0

## 2016-06-27 LAB — BASIC METABOLIC PANEL
Anion gap: 8 (ref 5–15)
BUN: 18 mg/dL (ref 6–20)
CO2: 26 mmol/L (ref 22–32)
Calcium: 8.5 mg/dL — ABNORMAL LOW (ref 8.9–10.3)
Chloride: 102 mmol/L (ref 101–111)
Creatinine, Ser: 1.1 mg/dL (ref 0.61–1.24)
GFR calc Af Amer: >60 mL/min (ref >60)
GFR calc non Af Amer: >60 mL/min (ref >60)
Glucose, Bld: 101 mg/dL — ABNORMAL HIGH (ref 65–99)
Potassium: 4.1 mmol/L (ref 3.5–5.1)
Sodium: 136 mmol/L (ref 135–145)

## 2016-06-27 LAB — BPAM RBC
Blood Product Expiration Date: 201804272359
Blood Product Expiration Date: 201804272359
Blood Product Expiration Date: 201805142359
Blood Product Expiration Date: 201805142359
ISSUE DATE / TIME: 201804191533
ISSUE DATE / TIME: 201804191631
ISSUE DATE / TIME: 201804210807
Unit Type and Rh: 600
Unit Type and Rh: 600
Unit Type and Rh: 600
Unit Type and Rh: 600

## 2016-06-27 LAB — CBC
HCT: 27.4 % — ABNORMAL LOW (ref 39.0–52.0)
Hemoglobin: 9.1 g/dL — ABNORMAL LOW (ref 13.0–17.0)
MCH: 29.4 pg (ref 26.0–34.0)
MCHC: 33.2 g/dL (ref 30.0–36.0)
MCV: 88.7 fL (ref 78.0–100.0)
Platelets: 179 10*3/uL (ref 150–400)
RBC: 3.09 MIL/uL — ABNORMAL LOW (ref 4.22–5.81)
RDW: 16.1 % — ABNORMAL HIGH (ref 11.5–15.5)
WBC: 10.3 10*3/uL (ref 4.0–10.5)

## 2016-06-27 LAB — GLUCOSE, CAPILLARY: Glucose-Capillary: 88 mg/dL (ref 65–99)

## 2016-06-27 NOTE — Progress Notes (Addendum)
[id number]       [place] [id number]             [phone]      4 Days Post-Op Procedure(s) (LRB): AORTIC VALVE REPLACEMENT (AVR) (N/A) TRANSESOPHAGEAL ECHOCARDIOGRAM (TEE) (N/A) Subjective: Feels well, getting stronger, Mils DOE  Objective: Vital signs in last 24 hours: Temp:  [98.2 F (36.8 C)-99.1 F (37.3 C)] 98.7 F (37.1 C) (04/23 0420) Pulse Rate:  [65-89] 78 (04/23 0420) Cardiac Rhythm: Other (Comment) (04/22 1900) Resp:  [8-18] 18 (04/23 0420) BP: (90-150)/(56-76) 150/76 (04/23 0420) SpO2:  [91 %-98 %] 95 % (04/23 0743) FiO2 (%):  [21 %] 21 % (04/23 0743) Weight:  [175 lb 6.4 oz (79.6 kg)] 175 lb 6.4 oz (79.6 kg) (04/23 0420)  Hemodynamic parameters for last 24 hours:    Intake/Output from previous day: 04/22 0701 - 04/23 0700 In: -  Out: 1350 [Urine:1350] Intake/Output this shift: No intake/output data recorded.  General appearance: alert, cooperative and no distress Heart: regular rate and rhythm and soft systolic murmur Lungs: mildly dim in bases Abdomen: Mild dist, nontender Extremities: trace edema Wound: incis healing well  Lab Results:  Recent Labs  06/26/16 0254 06/27/16 0450  WBC 12.4* 10.3  HGB 8.8* 9.1*  HCT 26.3* 27.4*  PLT 128* 179   BMET:  Recent Labs  06/26/16 0254 06/27/16 0450  NA 134* 136  K 4.8 4.1  CL 102 102  CO2 25 26  GLUCOSE 94 101*  BUN 23* 18  CREATININE 1.35* 1.10  CALCIUM 7.9* 8.5*    PT/INR: No results for input(s): LABPROT, INR in the last 72 hours. ABG    Component Value Date/Time   PHART 7.419 06/24/2016 1001   HCO3 21.7 06/24/2016 1001   TCO2 24 06/25/2016 1557   ACIDBASEDEF 2.0 06/24/2016 1001   O2SAT 96.0 06/24/2016 1001   CBG (last 3)   Recent Labs  06/25/16 1946 06/25/16 2336 06/26/16 0833  GLUCAP 120* 91 88    Meds Scheduled Meds: . acetaminophen  1,000 mg Oral Q6H   Or  . acetaminophen (TYLENOL) oral liquid 160 mg/5 mL  1,000 mg Per Tube Q6H  .  amLODipine  5 mg Oral Daily  . aspirin EC  325 mg Oral Daily   Or  . aspirin  324 mg Per Tube Daily  . bisacodyl  10 mg Oral Daily   Or  . bisacodyl  10 mg Rectal Daily  . budesonide (PULMICORT) nebulizer solution  0.5 mg Nebulization BID  . docusate sodium  200 mg Oral Daily  . fentaNYL  100 mcg Transdermal Q72H  . furosemide  40 mg Oral Daily  . guaiFENesin  600 mg Oral BID  . magnesium hydroxide  30 mL Oral Daily  . metoprolol tartrate  25 mg Oral BID  . pantoprazole  40 mg Oral Daily  . rosuvastatin  20 mg Oral q1800  . sertraline  50 mg Oral Daily  . sodium chloride flush  3 mL Intravenous Q12H  . sodium chloride flush  3 mL Intravenous Q12H   Continuous Infusions: . sodium chloride Stopped (06/24/16 0900)  . sodium chloride    . sodium chloride 10 mL/hr at 06/25/16 2000  . sodium chloride    . lactated ringers Stopped (06/25/16 0700)  . lactated ringers 20 mL/hr at 06/25/16 2000   PRN Meds:.sodium chloride, sodium chloride, ALPRAZolam, hydrALAZINE, metoprolol, midazolam, ondansetron (ZOFRAN) IV, oxyCODONE, pneumococcal 23 valent vaccine, sodium chloride flush,  sodium chloride flush, traMADol  Xrays Dg Chest Port 1 View  Result Date: 06/26/2016 CLINICAL DATA:  Status post AVR EXAM: PORTABLE CHEST 1 VIEW COMPARISON:  06/25/2016 FINDINGS: Post sternotomy changes.  Left lung base is not included. Catheter on the right overlies the upper SVC. Continued dense consolidation in the left lower lobe. Increased hazy edema or infiltrates at the bilateral lung bases. Small right-sided pleural effusion. Stable enlarged cardiomediastinal silhouette with atherosclerosis. IMPRESSION: 1. Non inclusion of the left lung base 2. Increased hazy bibasilar opacity may reflect increasing edema or infiltrate. 3. Cardiomegaly with persistent small effusions and dense left lower lobe consolidation Electronically Signed   By: Donavan Foil M.D.   On: 06/26/2016 03:50    Assessment/Plan: S/P  Procedure(s) (LRB): AORTIC VALVE REPLACEMENT (AVR) (N/A) TRANSESOPHAGEAL ECHOCARDIOGRAM (TEE) (N/A)  1 doing well 2 sinus rhythm 3 hemodyn stable, SBP ranges from 90-150 4 creat improved 5 H/H improved, thrombocytopenia improved 6 push rehab/pulm toilet- postop atx 7 d/c wires 8 cont gentle diuresis for mild volume overload  LOS: 4 days    GOLD,WAYNE E 06/27/2016 Remove epicardial pacing wires today Ready for discharge tomorrow patient examined and medical record reviewed,agree with above note. Tharon Aquas Trigt III 06/27/2016

## 2016-06-27 NOTE — Care Management Note (Signed)
Case Management Note [name] RN, BSN Unit 2W-Case Manager [phone]  Patient Details  Name: Carl Hunt MRN: [id number] Date of Birth: 07-May-1955  Subjective/Objective:  Pt admitted s/p AVR on 4/19- tx from [place] to 2W on 06/26/16                  Action/Plan: PTA pt lived at home - plan is for sister to stay with pt at discharge- states he has family members to assist- From [place]. CM to follow for d/c needs  Expected Discharge Date:                  Expected Discharge Plan:  Home/Self Care  In-House Referral:     Discharge planning Services  CM Consult  Post Acute Care Choice:    Choice offered to:     DME Arranged:    DME Agency:     HH Arranged:    HH Agency:     Status of Service:  In process, will continue to follow  If discussed at Long Length of Stay Meetings, dates discussed:  4/24  Discharge Disposition:   Additional Comments:  [name], RN 06/27/2016, 11:25 AM

## 2016-06-27 NOTE — Progress Notes (Signed)
CARDIAC REHAB PHASE I   PRE:  Rate/Rhythm: 93 SR  BP:  Supine:   Sitting: 150/70  Standing:    SaO2: 95%RA  MODE:  Ambulation: 450 ft   POST:  Rate/Rhythm: 83-86 SR  BP:  Supine: 140/60  Sitting:   Standing:    SaO2: 91-92%RA 1107-1152 Pt walked 450 ft on RA with rolling walker independently, stopping to rest hip at times. Stated his left knee starts to hurt when he walks due to hip. Tolerated well. To bed after walk and set up lunch.   Graylon Good, RN BSN  06/27/2016 11:49 AM
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
## 2016-06-28 ENCOUNTER — Encounter (HOSPITAL_COMMUNITY): Payer: Self-pay | Admitting: Cardiothoracic Surgery

## 2016-06-28 LAB — ECHO TEE
AV Mean grad: 50 mmHg
Ao-asc: 4 cm

## 2016-06-28 MED ORDER — AMLODIPINE BESYLATE 5 MG PO TABS: 5.0000 mg | ORAL_TABLET | Freq: Every day | ORAL | 1 refills | Status: DC

## 2016-06-28 MED ORDER — FUROSEMIDE 40 MG PO TABS: 40.0000 mg | ORAL_TABLET | Freq: Every day | ORAL | 0 refills | Status: DC

## 2016-06-28 MED ORDER — ASPIRIN 325 MG PO TBEC: 325.0000 mg | DELAYED_RELEASE_TABLET | Freq: Every day | ORAL | Status: DC

## 2016-06-28 MED ORDER — HYDROCODONE-ACETAMINOPHEN 10-325 MG PO TABS: 1.0000 | ORAL_TABLET | Freq: Four times a day (QID) | ORAL | 0 refills | Status: AC | PRN

## 2016-06-28 NOTE — Discharge Instructions (Signed)
Aortic Valve Replacement, Care After °Refer to this sheet in the next few weeks. These instructions provide you with information about caring for yourself after your procedure. Your health care provider may also give you more specific instructions. Your treatment has been planned according to current medical practices, but problems sometimes occur. Call your health care provider if you have any problems or questions after your procedure. °What can I expect after the procedure? °After the procedure, it is common to have: °· Pain around your incision area. °· A small amount of blood or clear fluid coming from your incision. ° °Follow these instructions at home: °Eating and drinking ° °· Follow instructions from your health care provider about eating or drinking restrictions. °? Limit alcohol intake to no more than 1 drink per day for nonpregnant women and 2 drinks per day for men. One drink equals 12 oz of beer, 5 oz of wine, or 1½ oz of hard liquor. °? Limit how much caffeine you drink. Caffeine can affect your heart's rate and rhythm. °· Drink enough fluid to keep your urine clear or pale yellow. °· Eat a heart-healthy diet. This should include plenty of fresh fruits and vegetables. If you eat meat, it should be lean cuts. Avoid foods that are: °? High in salt, saturated fat, or sugar. °? Canned or highly processed. °? Fried. °Activity °· Return to your normal activities as told by your health care provider. Ask your health care provider what activities are safe for you. °· Exercise regularly once you have recovered, as told by your health care provider. °· Avoid sitting for more than 2 hours at a time without moving. Get up and move around at least once every 1-2 hours. This helps to prevent blood clots in the legs. °· Do not lift anything that is heavier than 10 lb (4.5 kg) until your health care provider approves. °· Avoid pushing or pulling things with your arms until your health care provider approves. This  includes pulling on handrails to help you climb stairs. °Incision care ° °· Follow instructions from your health care provider about how to take care of your incision. Make sure you: °? Wash your hands with soap and water before you change your bandage (dressing). If soap and water are not available, use hand sanitizer. °? Change your dressing as told by your health care provider. °? Leave stitches (sutures), skin glue, or adhesive strips in place. These skin closures may need to stay in place for 2 weeks or longer. If adhesive strip edges start to loosen and curl up, you may trim the loose edges. Do not remove adhesive strips completely unless your health care provider tells you to do that. °· Check your incision area every day for signs of infection. Check for: °? More redness, swelling, or pain. °? More fluid or blood. °? Warmth. °? Pus or a bad smell. °Medicines °· Take over-the-counter and prescription medicines only as told by your health care provider. °· If you were prescribed an antibiotic medicine, take it as told by your health care provider. Do not stop taking the antibiotic even if you start to feel better. °Travel °· Avoid airplane travel for as long as told by your health care provider. °· When you travel, bring a list of your medicines and a record of your medical history with you. Carry your medicines with you. °Driving °· Ask your health care provider when it is safe for you to drive. Do not drive until your health   care provider approves. °· Do not drive or operate heavy machinery while taking prescription pain medicine. °Lifestyle ° °· Do not use any tobacco products, such as cigarettes, chewing tobacco, or e-cigarettes. If you need help quitting, ask your health care provider. °· Resume sexual activity as told by your health care provider. Do not use medicines for erectile dysfunction unless your health care provider approves, if this applies. °· Work with your health care provider to keep your  blood pressure and cholesterol under control, and to manage any other heart conditions that you have. °· Maintain a healthy weight. °General instructions °· Do not take baths, swim, or use a hot tub until your health care provider approves. °· Do not strain to have a bowel movement. °· Avoid crossing your legs while sitting down. °· Check your temperature every day for a fever. A fever may be a sign of infection. °· If you are a woman and you plan to become pregnant, talk with your health care provider before you become pregnant. °· Wear compression stockings if your health care provider instructs you to do this. These stockings help to prevent blood clots and reduce swelling in your legs. °· Tell all health care providers who care for you that you have an artificial (prosthetic) aortic valve. If you have or have had heart disease or endocarditis, tell all health care providers about these conditions as well. °· Keep all follow-up visits as told by your health care provider. This is important. °Contact a health care provider if: °· You develop a skin rash. °· You experience sudden, unexplained changes in your weight. °· You have more redness, swelling, or pain around your incision. °· You have more fluid or blood coming from your incision. °· Your incision feels warm to the touch. °· You have pus or a bad smell coming from your incision. °· You have a fever. °Get help right away if: °· You develop chest pain that is different from the pain coming from your incision. °· You develop shortness of breath or difficulty breathing. °· You start to feel light-headed. °These symptoms may represent a serious problem that is an emergency. Do not wait to see if the symptoms will go away. Get medical help right away. Call your local emergency services (911 in the [place] Do not drive yourself to the hospital. °This information is not intended to replace advice given to you by your health care provider. Make sure you discuss any  questions you have with your health care provider. °Document Released: 09/09/2004 Document Revised: 07/30/2015 Document Reviewed: 01/25/2015 °Elsevier Interactive Patient Education © 2017 [employer] ° °

## 2016-06-28 NOTE — Progress Notes (Signed)
06/28/2016 1:17 PM Discharge AVS meds taken today and those due this evening reviewed.  Follow-up appointments and when to call md reviewed.  D/C IV and TELE.  Questions and concerns addressed.   D/C home per orders. [place]

## 2016-06-28 NOTE — Progress Notes (Signed)
CARDIAC REHAB PHASE I   PRE:  Rate/Rhythm: 87 SR  BP:  Supine:   Sitting: 134/70  Standing:    SaO2: 95%RA  MODE:  Ambulation: 1200 ft   POST:  Rate/Rhythm: 100 SR  BP:  Supine:   Sitting: 146/74  Standing:    SaO2: 95%RA 1026-1136 Pt walked 1200 ft on RA with rolling walker independently with steady gait. Getting rolling walker for home use. Pt able to walk and talk and take his time and hip did well. Education completed with pt who voiced understanding. Not interested in CRP 2. Discussed heart healthy diet, IS, and sternal precautions. Encouraged walking as tolerated.   Graylon Good, RN BSN  06/28/2016 11:37 AM

## 2016-06-28 NOTE — Care Management Note (Signed)
Case Management Note [name] RN, BSN Unit 2W-Case Manager [phone]  Patient Details  Name: Carl Hunt MRN: [id number] Date of Birth: February 19, 1956  Subjective/Objective:  Pt admitted s/p AVR on 4/19- tx from [place] to 2W on 06/26/16                  Action/Plan: PTA pt lived at home - plan is for sister to stay with pt at discharge- states he has family members to assist- From [place]. CM to follow for d/c needs  Expected Discharge Date:  06/28/16               Expected Discharge Plan:  Home/Self Care  In-House Referral:     Discharge planning Services  CM Consult  Post Acute Care Choice:  Durable Medical Equipment Choice offered to:  Patient  DME Arranged:  Gilford Rile rolling DME Agency:  [place]:  NA [name] Agency:     Status of Service:  Completed, signed off  If discussed at [place] of Stay Meetings, dates discussed:  4/24  Discharge Disposition: home/self care   Additional Comments:  06/28/16- 1030- [name] RN, CM- pt for d/c home today- per bedside RN- pt has decided that he need RW for home- order has been placed- notified [name] with [facility] for DME need- RW to be delivered to room prior to discharge.   [name], RN 06/28/2016, 10:59 AM
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
## 2016-06-28 NOTE — Progress Notes (Signed)
06/28/2016 12:22 PM CT sutures D/C'D without difficulty. [place]

## 2016-06-28 NOTE — Progress Notes (Signed)
[address]       [place] [address]             [phone]      5 Days Post-Op Procedure(s) (LRB): AORTIC VALVE REPLACEMENT (AVR) (N/A) TRANSESOPHAGEAL ECHOCARDIOGRAM (TEE) (N/A) Subjective: Feels well  Objective: Vital signs in last 24 hours: Temp:  [98.2 F (36.8 C)-98.8 F (37.1 C)] 98.4 F (36.9 C) (04/24 0535) Pulse Rate:  [60-68] 66 (04/24 0535) Cardiac Rhythm: Normal sinus rhythm (04/23 1930) Resp:  [18] 18 (04/24 0535) BP: (107-132)/(62-71) 116/62 (04/24 0535) SpO2:  [93 %-100 %] 96 % (04/24 0535) Weight:  [169 lb 6.4 oz (76.8 kg)] 169 lb 6.4 oz (76.8 kg) (04/24 0535)  Hemodynamic parameters for last 24 hours:    Intake/Output from previous day: No intake/output data recorded. Intake/Output this shift: No intake/output data recorded.  General appearance: alert, cooperative and no distress Heart: regular rate and rhythm and + systolic murmur Lungs: mildly dim in bases Abdomen: benign Extremities: no edema Wound: incis healing well  Lab Results:  Recent Labs  06/26/16 0254 06/27/16 0450  WBC 12.4* 10.3  HGB 8.8* 9.1*  HCT 26.3* 27.4*  PLT 128* 179   BMET:  Recent Labs  06/26/16 0254 06/27/16 0450  NA 134* 136  K 4.8 4.1  CL 102 102  CO2 25 26  GLUCOSE 94 101*  BUN 23* 18  CREATININE 1.35* 1.10  CALCIUM 7.9* 8.5*    PT/INR: No results for input(s): LABPROT, INR in the last 72 hours. ABG    Component Value Date/Time   PHART 7.419 06/24/2016 1001   HCO3 21.7 06/24/2016 1001   TCO2 24 06/25/2016 1557   ACIDBASEDEF 2.0 06/24/2016 1001   O2SAT 96.0 06/24/2016 1001   CBG (last 3)   Recent Labs  06/25/16 2336 06/26/16 0456 06/26/16 0833  GLUCAP 91 88 88    Meds Scheduled Meds: . acetaminophen  1,000 mg Oral Q6H   Or  . acetaminophen (TYLENOL) oral liquid 160 mg/5 mL  1,000 mg Per Tube Q6H  . amLODipine  5 mg Oral Daily  . aspirin EC  325 mg Oral Daily   Or  . aspirin  324 mg Per Tube Daily  .  bisacodyl  10 mg Oral Daily   Or  . bisacodyl  10 mg Rectal Daily  . budesonide (PULMICORT) nebulizer solution  0.5 mg Nebulization BID  . docusate sodium  200 mg Oral Daily  . fentaNYL  100 mcg Transdermal Q72H  . furosemide  40 mg Oral Daily  . guaiFENesin  600 mg Oral BID  . magnesium hydroxide  30 mL Oral Daily  . metoprolol tartrate  25 mg Oral BID  . pantoprazole  40 mg Oral Daily  . rosuvastatin  20 mg Oral q1800  . sertraline  50 mg Oral Daily  . sodium chloride flush  3 mL Intravenous Q12H  . sodium chloride flush  3 mL Intravenous Q12H   Continuous Infusions: . sodium chloride Stopped (06/24/16 0900)  . sodium chloride    . sodium chloride 10 mL/hr at 06/25/16 2000  . sodium chloride    . lactated ringers Stopped (06/25/16 0700)  . lactated ringers 20 mL/hr at 06/25/16 2000   PRN Meds:.sodium chloride, sodium chloride, ALPRAZolam, hydrALAZINE, metoprolol, midazolam, ondansetron (ZOFRAN) IV, oxyCODONE, pneumococcal 23 valent vaccine, sodium chloride flush, sodium chloride flush, traMADol  Xrays Dg Chest 2 View  Result Date: 06/27/2016 CLINICAL DATA:  61 year old male status post  aortic valve replacement EXAM: CHEST  2 VIEW COMPARISON:  Prior chest x-ray 06/26/2016 FINDINGS: Status post median sternotomy with evidence of mechanical aortic valve replacement. The right IJ vascular sheath has been removed. Atherosclerotic calcifications are present in the transverse aorta. There are small bilateral effusions and associated lower lobe atelectasis. Focal patchy airspace opacity in the right middle lobe is nonspecific but likely related to atelectasis. Stable cardiomegaly. No pulmonary edema. No pneumothorax. No acute osseous abnormality. IMPRESSION: 1. New development of focal patchy airspace opacity in the right middle lobe which likely represents atelectasis. In the appropriate clinical setting, early bronchopneumonia could appear similar. 2. Persistent small bilateral pleural  effusions and associated lower lobe atelectasis. Electronically Signed   By: Jacqulynn Cadet M.D.   On: 06/27/2016 07:54    Assessment/Plan: S/P Procedure(s) (LRB): AORTIC VALVE REPLACEMENT (AVR) (N/A) TRANSESOPHAGEAL ECHOCARDIOGRAM (TEE) (N/A) Plan for discharge: see discharge orders   LOS: 5 days    Jennica Tagliaferri E 06/28/2016

## 2016-06-28 NOTE — Anesthesia Postprocedure Evaluation (Addendum)
Anesthesia Post Note  Patient: Carl Hunt  Procedure(s) Performed: Procedure(s) (LRB): AORTIC VALVE REPLACEMENT (AVR) (N/A) TRANSESOPHAGEAL ECHOCARDIOGRAM (TEE) (N/A)  Patient location during evaluation: ICU Anesthesia Type: General Level of consciousness: sedated Pain management: pain level controlled Vital Signs Assessment: post-procedure vital signs reviewed and stable Respiratory status: patient remains intubated per anesthesia plan Cardiovascular status: stable Anesthetic complications: no       Last Vitals:  Vitals:   06/28/16 0946 06/28/16 0947  BP: (!) 141/70 (!) 141/70  Pulse: 70   Resp:    Temp:      Last Pain:  Vitals:   06/28/16 1111  TempSrc:   PainSc: 5                  Carl Hunt

## 2016-07-19 ENCOUNTER — Telehealth: Payer: Self-pay

## 2016-07-19 NOTE — Telephone Encounter (Signed)
Patient is requesting that his PCP/David Damita Dunnings MD, [facility], [phone]. Prescribe his pain medications. Dr Damita Dunnings is currently managing his pain prior to surgery and agreed to continue doing so. Dr Prescott Gum will not refill any narcotic for patient. He will be referred to his PCP.

## 2016-07-27 ENCOUNTER — Ambulatory Visit: Payer: Self-pay | Admitting: Cardiothoracic Surgery

## 2016-07-28 ENCOUNTER — Other Ambulatory Visit: Payer: Self-pay | Admitting: Cardiothoracic Surgery

## 2016-07-28 DIAGNOSIS — Z952 Presence of prosthetic heart valve: Secondary | ICD-10-CM

## 2016-07-29 ENCOUNTER — Ambulatory Visit (INDEPENDENT_AMBULATORY_CARE_PROVIDER_SITE_OTHER): Payer: Self-pay | Admitting: Cardiothoracic Surgery

## 2016-07-29 ENCOUNTER — Encounter: Payer: Self-pay | Admitting: Cardiothoracic Surgery

## 2016-07-29 ENCOUNTER — Ambulatory Visit
Admission: RE | Admit: 2016-07-29 | Discharge: 2016-07-29 | Disposition: A | Discharge status: Home / Self Care | Payer: Medicare Other | Source: Ambulatory Visit | Attending: Cardiothoracic Surgery | Admitting: Cardiothoracic Surgery

## 2016-07-29 VITALS — BP 122/70 | HR 78 | Resp 16 | Ht 68.0 in | Wt 170.6 lb

## 2016-07-29 DIAGNOSIS — Z952 Presence of prosthetic heart valve: Secondary | ICD-10-CM

## 2016-07-29 DIAGNOSIS — I350 Nonrheumatic aortic (valve) stenosis: Secondary | ICD-10-CM

## 2016-07-29 NOTE — Progress Notes (Signed)
PCP is System, Pcp Not In Referring Provider is Lacretia Leigh, MD  Chief Complaint  Patient presents with  . Routine Post Op    f/u from surgery with CXR s/p AVR 06/23/16    HPI: The patient returns 1 month follow-up after aortic valve replacement for severe aortic stenosis. He had a pericardial tissue valve placed, 21 mm with BSA 1.7. Transvalvular gradient was measured at surgery at 10 mmHg. He is done well. He is maintained sinus rhythm. His symptoms of AS-shortness of breath, have improved. Surgical incision is well-healed.  He noticed last week after a heavy meal he developed bilateral ankle swelling. He has mild ankle edema at the time of discharge and Lasix worked nicely. His ejection fraction is approximately 45%. He will be given another course of oral Lasix.  Most importantly the patient wishes to proceed with left hip replacement as he can barely walk due to arthritis of his left hip. I told him he would need to wait 3 months after surgery or mid July. He plans on having a hip replacement done in [facility] by Dr. Durward Fortes  Past Medical History:  Diagnosis Date  . Anxiety   . Aortic stenosis    04/09/15 echo (Dr. Orpah Greek): moderate to severe AS, flow 526.8 cm/sec, max PG 111, mean 69.1, AVA 0.61 cm sq  . Aortic stenosis   . Arthritis   . CAD (coronary artery disease)   . Cancer ([place])    SKIN CA  . Depression   . Heart murmur     Past Surgical History:  Procedure Laterality Date  . AORTIC VALVE REPLACEMENT N/A 06/23/2016   Procedure: AORTIC VALVE REPLACEMENT (AVR);  Surgeon: Ivin Poot, MD;  Location: [facility];  Service: Open Heart Surgery;  Laterality: N/A;  . CARDIAC CATHETERIZATION  05/25/2016   Dr Sabra Heck  . CARDIOVASCULAR STRESS TEST     04/16/15 (Dr. Orpah Greek): NL LV contractility, EF 51%, no sign reversible ischemia, low risk scan  . KNEE ARTHROSCOPY     RIGHT  . LEG SURGERY     RIGHT LEG   . TEE WITHOUT CARDIOVERSION N/A 06/23/2016   Procedure:  TRANSESOPHAGEAL ECHOCARDIOGRAM (TEE);  Surgeon: Ivin Poot, MD;  Location: [facility];  Service: Open Heart Surgery;  Laterality: N/A;    No family history on file.  Social History Social History  Substance Use Topics  . Smoking status: Former Smoker    Packs/day: 0.50    Years: 35.00    Types: Cigarettes  . Smokeless tobacco: Never Used     Comment: March 2018 stopped smoking  . Alcohol use 0.6 oz/week    1 Cans of beer per week     Comment: occasional    Current Outpatient Prescriptions  Medication Sig Dispense Refill  . amLODipine (NORVASC) 5 MG tablet Take 1 tablet (5 mg total) by mouth daily. 30 tablet 1  . HYDROcodone-acetaminophen (NORCO) 10-325 MG tablet Take 1 tablet by mouth every 6 (six) hours as needed for moderate pain or severe pain. 30 tablet 0  . Menthol-Methyl Salicylate (MUSCLE RUB EX) Apply 1 application topically 4 (four) times daily as needed (for hip pain.).     Marland Kitchen Omega-3 Fatty Acids (FISH OIL) 1000 MG CAPS Take 1,000 mg by mouth 2 (two) times daily.    . rosuvastatin (CRESTOR) 20 MG tablet Take 20 mg by mouth daily with supper.     . sertraline (ZOLOFT) 50 MG tablet Take 50 mg by mouth daily with supper.    Marland Kitchen  aspirin EC 325 MG EC tablet Take 1 tablet (325 mg total) by mouth daily. (Patient not taking: Reported on 07/29/2016)    . furosemide (LASIX) 40 MG tablet Take 1 tablet (40 mg total) by mouth daily. (Patient not taking: Reported on 07/29/2016) 7 tablet 0   No current facility-administered medications for this visit.     Allergies  Allergen Reactions  . No Known Allergies     Review of Systems  Overall improved strength No fever Mild ankle edema Good appetite Continued severe left hip pain  BP 122/70 (BP Location: Left Arm, Patient Position: Sitting, Cuff Size: Large)   Pulse 78   Resp 16   Ht 5\' 8"  (1.727 m)   Wt 170 lb 9.6 oz (77.4 kg)   SpO2 98% Comment: RA  BMI 25.94 kg/m  Physical Exam       Exam    General- alert and  comfortable   Lungs- clear without rales, wheezes   Cor- regular rate and rhythm, no murmur , gallop   Abdomen- soft, non-tender   Extremities - warm, non-tender, minimal edema   Neuro- oriented, appropriate, no focal weakness   Diagnostic Tests:  Chest x-ray shows clear lung fields, no pleural effusion, stable cardiac silhouette after aVR. Sternal wires intact and well aligned   Impression: Doing well after aVR for severe ASO He will be ready to proceed with left total hip replacement mid July 2018 The patient was given a one-month prescription for of oral Lasix 40 mg daily for his recurring ankle edema, most likely from diastolic heart failure  Plan: Patient will be followed by his cardiologist Dr. Orpah Greek in [place]. I will see the patient as needed. Patient is reminded regarding antibiotic prophylaxis for any dental work now that he has prosthetic valve.  Len Childs, MD [facility] [phone]

## 2016-08-08 ENCOUNTER — Telehealth (INDEPENDENT_AMBULATORY_CARE_PROVIDER_SITE_OTHER): Payer: Self-pay | Admitting: Orthopaedic Surgery

## 2016-08-08 NOTE — Addendum Note (Signed)
Addendum  created 08/08/16 1407 by Oleta Mouse, MD   Sign clinical note

## 2016-08-08 NOTE — Telephone Encounter (Signed)
LVM with pt to call to schedule surgery. Will try pt again at a later time. 

## 2016-09-14 ENCOUNTER — Ambulatory Visit (INDEPENDENT_AMBULATORY_CARE_PROVIDER_SITE_OTHER): Payer: Medicare Other | Admitting: Orthopedic Surgery

## 2016-09-21 ENCOUNTER — Encounter (HOSPITAL_COMMUNITY): Payer: Self-pay

## 2016-09-21 ENCOUNTER — Ambulatory Visit (HOSPITAL_COMMUNITY)
Admission: RE | Admit: 2016-09-21 | Discharge: 2016-09-21 | Disposition: A | Discharge status: Home / Self Care | Payer: Medicare Other | Source: Ambulatory Visit | Attending: Orthopedic Surgery | Admitting: Orthopedic Surgery

## 2016-09-21 ENCOUNTER — Ambulatory Visit (INDEPENDENT_AMBULATORY_CARE_PROVIDER_SITE_OTHER): Payer: Medicare Other | Admitting: Orthopedic Surgery

## 2016-09-21 ENCOUNTER — Encounter (INDEPENDENT_AMBULATORY_CARE_PROVIDER_SITE_OTHER): Payer: Self-pay | Admitting: Orthopedic Surgery

## 2016-09-21 ENCOUNTER — Encounter (HOSPITAL_COMMUNITY)
Admission: RE | Admit: 2016-09-21 | Discharge: 2016-09-21 | Disposition: A | Discharge status: Home / Self Care | Payer: Medicare Other | Source: Ambulatory Visit | Attending: Orthopaedic Surgery | Admitting: Orthopaedic Surgery

## 2016-09-21 VITALS — BP 120/72 | HR 82 | Temp 98.0°F | Resp 18 | Ht 68.5 in | Wt 168.0 lb

## 2016-09-21 DIAGNOSIS — Z01818 Encounter for other preprocedural examination: Secondary | ICD-10-CM | POA: Diagnosis not present

## 2016-09-21 DIAGNOSIS — M87052 Idiopathic aseptic necrosis of left femur: Secondary | ICD-10-CM | POA: Diagnosis not present

## 2016-09-21 DIAGNOSIS — M1612 Unilateral primary osteoarthritis, left hip: Secondary | ICD-10-CM | POA: Diagnosis not present

## 2016-09-21 DIAGNOSIS — Z952 Presence of prosthetic heart valve: Secondary | ICD-10-CM | POA: Insufficient documentation

## 2016-09-21 LAB — CBC WITH DIFFERENTIAL/PLATELET
BASOS PCT: 1 %
Basophils Absolute: 0.1 10*3/uL (ref 0.0–0.1)
EOS ABS: 0.3 10*3/uL (ref 0.0–0.7)
EOS PCT: 4 %
HCT: 39.1 % (ref 39.0–52.0)
Hemoglobin: 12.5 g/dL — ABNORMAL LOW (ref 13.0–17.0)
LYMPHS ABS: 3.1 10*3/uL (ref 0.7–4.0)
Lymphocytes Relative: 35 %
MCH: 26.5 pg (ref 26.0–34.0)
MCHC: 32 g/dL (ref 30.0–36.0)
MCV: 82.8 fL (ref 78.0–100.0)
Monocytes Absolute: 0.9 10*3/uL (ref 0.1–1.0)
Monocytes Relative: 10 %
Neutro Abs: 4.6 10*3/uL (ref 1.7–7.7)
Neutrophils Relative %: 50 %
PLATELETS: 197 10*3/uL (ref 150–400)
RBC: 4.72 MIL/uL (ref 4.22–5.81)
RDW: 17.5 % — ABNORMAL HIGH (ref 11.5–15.5)
WBC: 8.9 10*3/uL (ref 4.0–10.5)

## 2016-09-21 LAB — URINALYSIS, ROUTINE W REFLEX MICROSCOPIC
Bilirubin Urine: NEGATIVE
Glucose, UA: NEGATIVE mg/dL
Hgb urine dipstick: NEGATIVE
Ketones, ur: NEGATIVE mg/dL
LEUKOCYTES UA: NEGATIVE
Nitrite: NEGATIVE
PROTEIN: NEGATIVE mg/dL
Specific Gravity, Urine: 1.003 — ABNORMAL LOW (ref 1.005–1.030)
pH: 7 (ref 5.0–8.0)

## 2016-09-21 LAB — APTT: aPTT: 27 seconds (ref 24–36)

## 2016-09-21 LAB — COMPREHENSIVE METABOLIC PANEL
ALT: 19 U/L (ref 17–63)
ANION GAP: 8 (ref 5–15)
AST: 24 U/L (ref 15–41)
Albumin: 3.9 g/dL (ref 3.5–5.0)
Alkaline Phosphatase: 74 U/L (ref 38–126)
BUN: 15 mg/dL (ref 6–20)
CHLORIDE: 102 mmol/L (ref 101–111)
CO2: 24 mmol/L (ref 22–32)
CREATININE: 1.56 mg/dL — AB (ref 0.61–1.24)
Calcium: 9.2 mg/dL (ref 8.9–10.3)
GFR calc non Af Amer: 47 mL/min — ABNORMAL LOW (ref >60)
GFR, EST AFRICAN AMERICAN: 54 mL/min — AB (ref >60)
Glucose, Bld: 102 mg/dL — ABNORMAL HIGH (ref 65–99)
Potassium: 4.6 mmol/L (ref 3.5–5.1)
SODIUM: 134 mmol/L — AB (ref 135–145)
Total Bilirubin: 0.8 mg/dL (ref 0.3–1.2)
Total Protein: 7.8 g/dL (ref 6.5–8.1)

## 2016-09-21 LAB — PROTIME-INR
INR: 1.1
Prothrombin Time: 14.2 seconds (ref 11.4–15.2)

## 2016-09-21 LAB — SURGICAL PCR SCREEN
MRSA, PCR: NEGATIVE
STAPHYLOCOCCUS AUREUS: POSITIVE — AB
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
## 2016-09-21 NOTE — Progress Notes (Addendum)
PCP is Dr. Adonis Housekeeper Cardiologist is Dr. Orpah Greek Dr Nils Pyle saw him last 07-29-16  has surgery with Dr Lucianne Lei Tright 4- 2018 Echo noted from 06-13-16 Stress test noted from 05-10-16 Card cath noted from 05-25-16 Type and screen will need to be collected on day of surgery since he had a transfusion.

## 2016-09-21 NOTE — Pre-Procedure Instructions (Signed)
NYMIR RINGLER  09/21/2016      [place], [place] - [address] [place] [address] Phone: [phone] Fax: [place] [address] Phone: [phone] Fax: [phone]    Your procedure is scheduled on July 31   Report to [facility] at 530 A.M.  Call this number if you have problems the morning of surgery:  [phone]   Remember:  Do not eat food or drink liquids after midnight.  Take these medicines the morning of surgery with A SIP OF WATER Xanax, Hydrocodone (Norco) if needed  Stop/ take aspirin as directed by your Dr. Stop taking BC's, Goody's, Vitamins, Herbal medications, Fish Oil, Ibuprofen, Advil, Motrin, Aleve   Do not wear jewelry, make-up or nail polish.  Do not wear lotions, powders, or perfumes, or deoderant.  Do not shave 48 hours prior to surgery.  Men may shave face and neck.  Do not bring valuables to the hospital.  [facility] is not responsible for any belongings or valuables.  Contacts, dentures or bridgework may not be worn into surgery.  Leave your suitcase in the car.  After surgery it may be brought to your room.  For patients admitted to the hospital, discharge time will be determined by your treatment team.  Patients discharged the day of surgery will not be allowed to drive home.  Special instructions:  [facility] - Preparing for Surgery  Before surgery, you can play an important role.  Because skin is not sterile, your skin needs to be as free of germs as possible.  You can reduce the number of germs on you skin by washing with CHG (chlorahexidine gluconate) soap before surgery.  CHG is an antiseptic cleaner which kills germs and bonds with the skin to continue killing germs even after washing.  Please DO NOT use if you have an allergy to CHG or antibacterial soaps.  If your  skin becomes reddened/irritated stop using the CHG and inform your nurse when you arrive at Short Stay.  Do not shave (including legs and underarms) for at least 48 hours prior to the first CHG shower.  You may shave your face.  Please follow these instructions carefully:   1.  Shower with CHG Soap the night before surgery and the  morning of Surgery.  2.  If you choose to wash your hair, wash your hair first as usual with your  normal shampoo.  3.  After you shampoo, rinse your hair and body thoroughly to remove the Shampoo.  4.  Use CHG as you would any other liquid soap.  You can apply chg directly  to the skin and wash gently with scrungie or a clean washcloth.  5.  Apply the CHG Soap to your body ONLY FROM THE NECK DOWN.  Do not use on open wounds or open sores.  Avoid contact with your eyes,       ears, mouth and genitals (private parts).  Wash genitals (private parts)  with your normal soap.  6.  Wash thoroughly, paying special attention to the area where your surgery   will be performed.  7.  Thoroughly rinse your body with warm water from the neck down.  8.  DO NOT shower/wash with your normal soap after using and rinsing off the CHG Soap.  9.  Pat yourself dry with  a clean towel.            10.  Wear clean pajamas.            11.  Place clean sheets on your bed the night of your first shower and do not sleep with pets.  Day of Surgery  Do not apply any lotions/deoderants the morning of surgery.  Please wear clean clothes to the hospital/surgery center.     Please read over the following fact sheets that you were given. Pain Booklet, Coughing and Deep Breathing, MRSA Information and Surgical Site Infection Prevention
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
## 2016-09-21 NOTE — Progress Notes (Signed)
Mupirocin Ointment Rx called into [facility] in [place], [place] for positive PCR of Staph. Left message on pt's voicemail informing him of results and need to pick up Rx.

## 2016-09-22 ENCOUNTER — Encounter (HOSPITAL_COMMUNITY): Payer: Self-pay

## 2016-09-22 LAB — URINE CULTURE: SPECIAL REQUESTS: NORMAL

## 2016-09-22 NOTE — Progress Notes (Signed)
thanks
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
## 2016-09-23 ENCOUNTER — Telehealth (INDEPENDENT_AMBULATORY_CARE_PROVIDER_SITE_OTHER): Payer: Self-pay

## 2016-09-23 NOTE — Telephone Encounter (Signed)
Carl Hunt from [facility] short stay called and gave results of urine culture had multiple species present and the urinalysis was negative

## 2016-09-23 NOTE — Progress Notes (Addendum)
Spoke with Marcie Bal from Dr. Rudene Anda office regarding an abnormal urine culture. Marcie Bal sts Dr. Durward Fortes will have the PA Aaron Edelman to f/u on the results.

## 2016-09-30 ENCOUNTER — Telehealth (INDEPENDENT_AMBULATORY_CARE_PROVIDER_SITE_OTHER): Payer: Self-pay | Admitting: Orthopaedic Surgery

## 2016-09-30 ENCOUNTER — Encounter (INDEPENDENT_AMBULATORY_CARE_PROVIDER_SITE_OTHER): Payer: Self-pay | Admitting: Orthopedic Surgery

## 2016-09-30 NOTE — Telephone Encounter (Signed)
BP called and spoke with Dr. Damita Dunnings

## 2016-09-30 NOTE — Telephone Encounter (Signed)
Patient left a message stating he had his labwork done, but hasn't heard anything. Patient wants to know if surgery is still scheduled. Please call to advise.

## 2016-09-30 NOTE — Progress Notes (Signed)
Telephone conversation with Dr. Josefine Class nurse reveals a B1 of 1.23 with normal 0.76-1.27 and a GFR is 64 inches normal ranges greater than 60. We'll continue with surgery as planned

## 2016-09-30 NOTE — Telephone Encounter (Signed)
Patient called wanting to make sure that he is still on schedule for surgery for Tuesday with Dr. Durward Fortes.  He stated that his PCP informed his blood work came back normal.  CB#[phone].  Thank you.

## 2016-10-03 ENCOUNTER — Encounter (HOSPITAL_COMMUNITY): Payer: Self-pay | Admitting: Anesthesiology

## 2016-10-03 MED ORDER — SODIUM CHLORIDE 0.9 % IV SOLN
2000.0000 mg | INTRAVENOUS | Status: AC
  Administered 2016-10-04: 2000 mg via TOPICAL
  Filled 2016-10-03: qty 20

## 2016-10-03 NOTE — Anesthesia Preprocedure Evaluation (Addendum)
Anesthesia Evaluation  Patient identified by MRN, date of birth, ID band Patient awake    Reviewed: Allergy & Precautions, NPO status , Patient's Chart, lab work & pertinent test results  Airway Mallampati: II  TM Distance: >3 FB Neck ROM: Full    Dental  (+) Teeth Intact, Dental Advisory Given   Pulmonary neg pulmonary ROS, former smoker,    breath sounds clear to auscultation       Cardiovascular + CAD  + Valvular Problems/Murmurs  Rhythm:Regular Rate:Normal     Neuro/Psych PSYCHIATRIC DISORDERS Anxiety Depression negative neurological ROS     GI/Hepatic negative GI ROS, Neg liver ROS,   Endo/Other  negative endocrine ROS  Renal/GU negative Renal ROS     Musculoskeletal  (+) Arthritis ,   Abdominal   Peds  Hematology negative hematology ROS (+)   Anesthesia Other Findings Day of surgery medications reviewed with the patient.  Reproductive/Obstetrics                            Lab Results  Component Value Date   WBC 8.9 09/21/2016   HGB 12.5 (L) 09/21/2016   HCT 39.1 09/21/2016   MCV 82.8 09/21/2016   PLT 197 09/21/2016   Lab Results  Component Value Date   CREATININE 1.56 (H) 09/21/2016   BUN 15 09/21/2016   NA 134 (L) 09/21/2016   K 4.6 09/21/2016   CL 102 09/21/2016   CO2 24 09/21/2016   EKG: normal sinus rhythm.  Echo:  Left ventricle: Normal cavity size. Concentric hypertrophy of moderate severity. LV systolic function is low normal with an EF of 50-55%. There are no obvious wall motion abnormalities.  Aortic valve: The valve is bicuspid. Severe valve thickening present. Severe valve calcification present. Severely decreased leaflet separation. Severe stenosis.  Right ventricle: Normal cavity size, wall thickness and ejection fraction.  Tricuspid valve: Trace regurgitation. The tricuspid valve regurgitation jet is central.  Mitral valve: No leaflet thickening and  calcification present. Mild mitral annular calcification. Trace regurgitation.  Aorta: The ascending aorta is moderately dilated.  Septum: No Patent Foramen Ovale present.  Left atrium: Patent foramen ovale not present.  Left ventricle: Normal cavity size.   Post bypass: AORTIC VALVE A bioprosthetic valve was placed. Valve is well seated. Leaflet is thin and freely mobile. paravalvular leak and The transaortic gradient is normal post replacement. Post-op transaortic gradient is 64mmHg.   Anesthesia Physical Anesthesia Plan  ASA: III  Anesthesia Plan: Spinal   Post-op Pain Management:    Induction: Intravenous  PONV Risk Score and Plan: 2 and Ondansetron, Dexamethasone and Propofol infusion  Airway Management Planned: Nasal Cannula  Additional Equipment:   Intra-op Plan:   Post-operative Plan:   Informed Consent: I have reviewed the patients History and Physical, chart, labs and discussed the procedure including the risks, benefits and alternatives for the proposed anesthesia with the patient or authorized representative who has indicated his/her understanding and acceptance.     Plan Discussed with: CRNA  Anesthesia Plan Comments:         Anesthesia Quick Evaluation

## 2016-10-04 ENCOUNTER — Inpatient Hospital Stay (HOSPITAL_COMMUNITY)
Admission: RE | Admit: 2016-10-04 | Discharge: 2016-10-05 | DRG: 470 | Disposition: A | Discharge status: Home Health | Payer: Medicare Other | Source: Ambulatory Visit | Attending: Orthopaedic Surgery | Admitting: Orthopaedic Surgery

## 2016-10-04 ENCOUNTER — Encounter (HOSPITAL_COMMUNITY)
Admission: RE | Disposition: A | Discharge status: Home Health | Payer: Self-pay | Source: Ambulatory Visit | Attending: Orthopaedic Surgery

## 2016-10-04 ENCOUNTER — Inpatient Hospital Stay (HOSPITAL_COMMUNITY): Payer: Medicare Other | Admitting: Vascular Surgery

## 2016-10-04 ENCOUNTER — Inpatient Hospital Stay (HOSPITAL_COMMUNITY): Payer: Medicare Other

## 2016-10-04 ENCOUNTER — Inpatient Hospital Stay (HOSPITAL_COMMUNITY): Payer: Medicare Other | Admitting: Emergency Medicine

## 2016-10-04 ENCOUNTER — Encounter (HOSPITAL_COMMUNITY): Payer: Self-pay | Admitting: *Deleted

## 2016-10-04 DIAGNOSIS — Z888 Allergy status to other drugs, medicaments and biological substances status: Secondary | ICD-10-CM | POA: Diagnosis not present

## 2016-10-04 DIAGNOSIS — F419 Anxiety disorder, unspecified: Secondary | ICD-10-CM | POA: Diagnosis present

## 2016-10-04 DIAGNOSIS — Z952 Presence of prosthetic heart valve: Secondary | ICD-10-CM

## 2016-10-04 DIAGNOSIS — M87052 Idiopathic aseptic necrosis of left femur: Secondary | ICD-10-CM

## 2016-10-04 DIAGNOSIS — M1612 Unilateral primary osteoarthritis, left hip: Secondary | ICD-10-CM | POA: Diagnosis present

## 2016-10-04 DIAGNOSIS — Z87891 Personal history of nicotine dependence: Secondary | ICD-10-CM | POA: Diagnosis not present

## 2016-10-04 DIAGNOSIS — I2510 Atherosclerotic heart disease of native coronary artery without angina pectoris: Secondary | ICD-10-CM | POA: Diagnosis present

## 2016-10-04 DIAGNOSIS — Z96642 Presence of left artificial hip joint: Secondary | ICD-10-CM

## 2016-10-04 DIAGNOSIS — Z9889 Other specified postprocedural states: Secondary | ICD-10-CM

## 2016-10-04 DIAGNOSIS — Z85828 Personal history of other malignant neoplasm of skin: Secondary | ICD-10-CM

## 2016-10-04 DIAGNOSIS — D62 Acute posthemorrhagic anemia: Secondary | ICD-10-CM | POA: Diagnosis not present

## 2016-10-04 DIAGNOSIS — Z79899 Other long term (current) drug therapy: Secondary | ICD-10-CM | POA: Diagnosis not present

## 2016-10-04 DIAGNOSIS — F329 Major depressive disorder, single episode, unspecified: Secondary | ICD-10-CM | POA: Diagnosis present

## 2016-10-04 HISTORY — PX: TOTAL HIP ARTHROPLASTY: SHX124

## 2016-10-04 LAB — TYPE AND SCREEN
ABO/RH(D): A NEG
Antibody Screen: NEGATIVE

## 2016-10-04 LAB — BASIC METABOLIC PANEL
ANION GAP: 6 (ref 5–15)
BUN: 13 mg/dL (ref 6–20)
CALCIUM: 8.9 mg/dL (ref 8.9–10.3)
CO2: 26 mmol/L (ref 22–32)
CREATININE: 1.24 mg/dL (ref 0.61–1.24)
Chloride: 100 mmol/L — ABNORMAL LOW (ref 101–111)
GFR calc Af Amer: >60 mL/min (ref >60)
Glucose, Bld: 108 mg/dL — ABNORMAL HIGH (ref 65–99)
Potassium: 4.5 mmol/L (ref 3.5–5.1)
SODIUM: 132 mmol/L — AB (ref 135–145)

## 2016-10-04 SURGERY — ARTHROPLASTY, HIP, TOTAL,POSTERIOR APPROACH
Anesthesia: Spinal | Site: Hip | Laterality: Left

## 2016-10-04 MED ORDER — ONDANSETRON HCL 4 MG/2ML IJ SOLN
INTRAMUSCULAR | Status: AC
  Filled 2016-10-04: qty 2

## 2016-10-04 MED ORDER — METOCLOPRAMIDE HCL 5 MG/ML IJ SOLN: 5.0000 mg | Freq: Three times a day (TID) | INTRAMUSCULAR | Status: DC | PRN

## 2016-10-04 MED ORDER — ONDANSETRON HCL 4 MG/2ML IJ SOLN
INTRAMUSCULAR | Status: DC | PRN
  Administered 2016-10-04: 4 mg via INTRAVENOUS

## 2016-10-04 MED ORDER — ONDANSETRON HCL 4 MG PO TABS: 4.0000 mg | ORAL_TABLET | Freq: Four times a day (QID) | ORAL | Status: DC | PRN

## 2016-10-04 MED ORDER — KETOROLAC TROMETHAMINE 15 MG/ML IJ SOLN
15.0000 mg | Freq: Four times a day (QID) | INTRAMUSCULAR | Status: AC
  Administered 2016-10-04 – 2016-10-05 (×4): 15 mg via INTRAVENOUS
  Filled 2016-10-04 (×3): qty 1

## 2016-10-04 MED ORDER — OXYCODONE HCL 5 MG PO TABS
5.0000 mg | ORAL_TABLET | ORAL | Status: DC | PRN
  Administered 2016-10-04 – 2016-10-05 (×7): 10 mg via ORAL
  Filled 2016-10-04 (×6): qty 2

## 2016-10-04 MED ORDER — MIDAZOLAM HCL 5 MG/5ML IJ SOLN
INTRAMUSCULAR | Status: DC | PRN
  Administered 2016-10-04: 2 mg via INTRAVENOUS

## 2016-10-04 MED ORDER — SERTRALINE HCL 50 MG PO TABS
50.0000 mg | ORAL_TABLET | Freq: Every day | ORAL | Status: DC
  Administered 2016-10-04: 50 mg via ORAL
  Filled 2016-10-04: qty 1

## 2016-10-04 MED ORDER — CEFAZOLIN SODIUM-DEXTROSE 1-4 GM/50ML-% IV SOLN
1.0000 g | Freq: Four times a day (QID) | INTRAVENOUS | Status: AC
  Administered 2016-10-04 (×2): 1 g via INTRAVENOUS
  Filled 2016-10-04 (×2): qty 50

## 2016-10-04 MED ORDER — ACETAMINOPHEN 10 MG/ML IV SOLN
1000.0000 mg | Freq: Once | INTRAVENOUS | Status: AC
  Administered 2016-10-04: 1000 mg via INTRAVENOUS
  Filled 2016-10-04: qty 100

## 2016-10-04 MED ORDER — DEXAMETHASONE SODIUM PHOSPHATE 4 MG/ML IJ SOLN
INTRAMUSCULAR | Status: DC | PRN
  Administered 2016-10-04: 10 mg via INTRAVENOUS

## 2016-10-04 MED ORDER — 0.9 % SODIUM CHLORIDE (POUR BTL) OPTIME
TOPICAL | Status: DC | PRN
  Administered 2016-10-04 (×2): 1000 mL

## 2016-10-04 MED ORDER — RIVAROXABAN 10 MG PO TABS
10.0000 mg | ORAL_TABLET | Freq: Every day | ORAL | Status: DC
  Administered 2016-10-05: 10 mg via ORAL
  Filled 2016-10-04: qty 1

## 2016-10-04 MED ORDER — DEXAMETHASONE SODIUM PHOSPHATE 10 MG/ML IJ SOLN
INTRAMUSCULAR | Status: AC
  Filled 2016-10-04: qty 1

## 2016-10-04 MED ORDER — FENTANYL CITRATE (PF) 250 MCG/5ML IJ SOLN
INTRAMUSCULAR | Status: AC
  Filled 2016-10-04: qty 5

## 2016-10-04 MED ORDER — METHOCARBAMOL 500 MG PO TABS
ORAL_TABLET | ORAL | Status: AC
  Filled 2016-10-04: qty 1

## 2016-10-04 MED ORDER — METHOCARBAMOL 1000 MG/10ML IJ SOLN
500.0000 mg | Freq: Four times a day (QID) | INTRAVENOUS | Status: DC | PRN
  Filled 2016-10-04: qty 5

## 2016-10-04 MED ORDER — CHLORHEXIDINE GLUCONATE 4 % EX LIQD: 60.0000 mL | Freq: Once | CUTANEOUS | Status: DC

## 2016-10-04 MED ORDER — MENTHOL 3 MG MT LOZG: 1.0000 | LOZENGE | OROMUCOSAL | Status: DC | PRN

## 2016-10-04 MED ORDER — LIDOCAINE 2% (20 MG/ML) 5 ML SYRINGE
INTRAMUSCULAR | Status: AC
  Filled 2016-10-04: qty 5

## 2016-10-04 MED ORDER — KETOROLAC TROMETHAMINE 15 MG/ML IJ SOLN
INTRAMUSCULAR | Status: AC
  Administered 2016-10-04: 15 mg via INTRAVENOUS
  Filled 2016-10-04: qty 1

## 2016-10-04 MED ORDER — ONDANSETRON HCL 4 MG/2ML IJ SOLN: 4.0000 mg | Freq: Four times a day (QID) | INTRAMUSCULAR | Status: DC | PRN

## 2016-10-04 MED ORDER — HYDROMORPHONE HCL 1 MG/ML IJ SOLN
0.5000 mg | INTRAMUSCULAR | Status: DC | PRN
  Administered 2016-10-04: 1 mg via INTRAVENOUS
  Filled 2016-10-04: qty 1

## 2016-10-04 MED ORDER — METOCLOPRAMIDE HCL 5 MG PO TABS: 5.0000 mg | ORAL_TABLET | Freq: Three times a day (TID) | ORAL | Status: DC | PRN

## 2016-10-04 MED ORDER — ACETAMINOPHEN 10 MG/ML IV SOLN
1000.0000 mg | Freq: Four times a day (QID) | INTRAVENOUS | Status: AC
  Administered 2016-10-04 – 2016-10-05 (×4): 1000 mg via INTRAVENOUS
  Filled 2016-10-04 (×4): qty 100

## 2016-10-04 MED ORDER — METHOCARBAMOL 500 MG PO TABS
500.0000 mg | ORAL_TABLET | Freq: Four times a day (QID) | ORAL | Status: DC | PRN
  Administered 2016-10-04: 500 mg via ORAL
  Filled 2016-10-04: qty 1

## 2016-10-04 MED ORDER — DOCUSATE SODIUM 100 MG PO CAPS
100.0000 mg | ORAL_CAPSULE | Freq: Two times a day (BID) | ORAL | Status: DC
  Administered 2016-10-04 – 2016-10-05 (×3): 100 mg via ORAL
  Filled 2016-10-04 (×3): qty 1

## 2016-10-04 MED ORDER — BUPIVACAINE-EPINEPHRINE (PF) 0.25% -1:200000 IJ SOLN
INTRAMUSCULAR | Status: DC | PRN
  Administered 2016-10-04: 30 mL

## 2016-10-04 MED ORDER — BISACODYL 10 MG RE SUPP: 10.0000 mg | Freq: Every day | RECTAL | Status: DC | PRN

## 2016-10-04 MED ORDER — ROSUVASTATIN CALCIUM 10 MG PO TABS
20.0000 mg | ORAL_TABLET | Freq: Every day | ORAL | Status: DC
  Administered 2016-10-04: 20 mg via ORAL
  Filled 2016-10-04: qty 2

## 2016-10-04 MED ORDER — SODIUM CHLORIDE 0.9 % IR SOLN: Status: DC | PRN

## 2016-10-04 MED ORDER — SODIUM CHLORIDE 0.9 % IV SOLN: INTRAVENOUS | Status: DC

## 2016-10-04 MED ORDER — ALUM & MAG HYDROXIDE-SIMETH 200-200-20 MG/5ML PO SUSP: 30.0000 mL | ORAL | Status: DC | PRN

## 2016-10-04 MED ORDER — PHENYLEPHRINE HCL 10 MG/ML IJ SOLN
INTRAMUSCULAR | Status: DC | PRN
  Administered 2016-10-04: 40 ug/min via INTRAVENOUS

## 2016-10-04 MED ORDER — PROPOFOL 500 MG/50ML IV EMUL
INTRAVENOUS | Status: DC | PRN
  Administered 2016-10-04: 75 ug/kg/min via INTRAVENOUS

## 2016-10-04 MED ORDER — EPHEDRINE SULFATE 50 MG/ML IJ SOLN
INTRAMUSCULAR | Status: DC | PRN
  Administered 2016-10-04: 10 mg via INTRAVENOUS

## 2016-10-04 MED ORDER — CEFAZOLIN SODIUM-DEXTROSE 2-4 GM/100ML-% IV SOLN
2.0000 g | INTRAVENOUS | Status: AC
  Administered 2016-10-04: 2 g via INTRAVENOUS
  Filled 2016-10-04: qty 100

## 2016-10-04 MED ORDER — PHENYLEPHRINE 40 MCG/ML (10ML) SYRINGE FOR IV PUSH (FOR BLOOD PRESSURE SUPPORT)
PREFILLED_SYRINGE | INTRAVENOUS | Status: AC
  Filled 2016-10-04: qty 10

## 2016-10-04 MED ORDER — FENTANYL CITRATE (PF) 100 MCG/2ML IJ SOLN
INTRAMUSCULAR | Status: DC | PRN
  Administered 2016-10-04: 50 ug via INTRAVENOUS
  Administered 2016-10-04: 100 ug via INTRAVENOUS

## 2016-10-04 MED ORDER — SODIUM CHLORIDE 0.9 % IV SOLN: 75.0000 mL/h | INTRAVENOUS | Status: DC

## 2016-10-04 MED ORDER — LIDOCAINE HCL (CARDIAC) 20 MG/ML IV SOLN
INTRAVENOUS | Status: DC | PRN
  Administered 2016-10-04: 40 mg via INTRAVENOUS

## 2016-10-04 MED ORDER — LACTATED RINGERS IV SOLN
INTRAVENOUS | Status: DC | PRN
  Administered 2016-10-04 (×3): via INTRAVENOUS

## 2016-10-04 MED ORDER — MAGNESIUM HYDROXIDE 400 MG/5ML PO SUSP: 30.0000 mL | Freq: Every day | ORAL | Status: DC | PRN

## 2016-10-04 MED ORDER — PROPOFOL 10 MG/ML IV BOLUS
INTRAVENOUS | Status: AC
  Filled 2016-10-04: qty 20

## 2016-10-04 MED ORDER — MIDAZOLAM HCL 2 MG/2ML IJ SOLN
INTRAMUSCULAR | Status: AC
  Filled 2016-10-04: qty 2

## 2016-10-04 MED ORDER — BUPIVACAINE IN DEXTROSE 0.75-8.25 % IT SOLN
INTRATHECAL | Status: DC | PRN
  Administered 2016-10-04: 2 mL via INTRATHECAL

## 2016-10-04 MED ORDER — ALPRAZOLAM 0.25 MG PO TABS
0.2500 mg | ORAL_TABLET | Freq: Four times a day (QID) | ORAL | Status: DC
  Administered 2016-10-04 – 2016-10-05 (×3): 0.25 mg via ORAL
  Filled 2016-10-04 (×3): qty 1

## 2016-10-04 MED ORDER — PHENOL 1.4 % MT LIQD: 1.0000 | OROMUCOSAL | Status: DC | PRN

## 2016-10-04 MED ORDER — BUPIVACAINE-EPINEPHRINE 0.25% -1:200000 IJ SOLN
INTRAMUSCULAR | Status: AC
  Filled 2016-10-04: qty 1

## 2016-10-04 MED ORDER — PROPOFOL 10 MG/ML IV BOLUS
INTRAVENOUS | Status: DC | PRN
  Administered 2016-10-04: 30 mg via INTRAVENOUS

## 2016-10-04 MED ORDER — MAGNESIUM CITRATE PO SOLN: 1.0000 | Freq: Once | ORAL | Status: DC | PRN

## 2016-10-04 MED ORDER — OXYCODONE HCL 5 MG PO TABS
ORAL_TABLET | ORAL | Status: AC
  Filled 2016-10-04: qty 2

## 2016-10-04 MED ORDER — DIPHENHYDRAMINE HCL 12.5 MG/5ML PO ELIX: 12.5000 mg | ORAL_SOLUTION | ORAL | Status: DC | PRN

## 2016-10-04 SURGICAL SUPPLY — 63 items
BLADE SAW SAG 73X25 THK (BLADE) ×2
BLADE SAW SGTL 73X25 THK (BLADE) ×1 IMPLANT
BLADE SURG 10 STRL SS (BLADE) ×4 IMPLANT
BRUSH FEMORAL CANAL (MISCELLANEOUS) IMPLANT
CAPT HIP TOTAL 2 ×2 IMPLANT
COVER SURGICAL LIGHT HANDLE (MISCELLANEOUS) ×3 IMPLANT
DRAPE INCISE IOBAN 66X45 STRL (DRAPES) IMPLANT
DRAPE ORTHO SPLIT 77X108 STRL (DRAPES) ×6
DRAPE SURG ORHT 6 SPLT 77X108 (DRAPES) ×2 IMPLANT
DRSG MEPILEX BORDER 4X12 (GAUZE/BANDAGES/DRESSINGS) ×3 IMPLANT
DURAPREP 26ML APPLICATOR (WOUND CARE) ×6 IMPLANT
ELECT BLADE 6.5 EXT (BLADE) ×2 IMPLANT
ELECT CAUTERY BLADE 6.4 (BLADE) ×2 IMPLANT
ELECT REM PT RETURN 9FT ADLT (ELECTROSURGICAL) ×3
ELECTRODE REM PT RTRN 9FT ADLT (ELECTROSURGICAL) ×1 IMPLANT
EVACUATOR 1/8 PVC DRAIN (DRAIN) IMPLANT
FACESHIELD WRAPAROUND (MASK) ×6 IMPLANT
FACESHIELD WRAPAROUND OR TEAM (MASK) ×3 IMPLANT
GLOVE BIOGEL PI IND STRL 6.5 (GLOVE) IMPLANT
GLOVE BIOGEL PI IND STRL 8 (GLOVE) ×2 IMPLANT
GLOVE BIOGEL PI IND STRL 8.5 (GLOVE) ×1 IMPLANT
GLOVE BIOGEL PI INDICATOR 6.5 (GLOVE) ×4
GLOVE BIOGEL PI INDICATOR 8 (GLOVE) ×4
GLOVE BIOGEL PI INDICATOR 8.5 (GLOVE) ×4
GLOVE ECLIPSE 8.0 STRL XLNG CF (GLOVE) ×6 IMPLANT
GLOVE INDICATOR 7.0 STRL GRN (GLOVE) ×4 IMPLANT
GLOVE SURG ORTHO 8.5 STRL (GLOVE) ×6 IMPLANT
GOWN STRL REUS W/ TWL LRG LVL3 (GOWN DISPOSABLE) ×2 IMPLANT
GOWN STRL REUS W/TWL 2XL LVL3 (GOWN DISPOSABLE) ×6 IMPLANT
GOWN STRL REUS W/TWL LRG LVL3 (GOWN DISPOSABLE) ×6
HANDPIECE INTERPULSE COAX TIP (DISPOSABLE)
IMMOBILIZER KNEE 22 (SOFTGOODS) ×2 IMPLANT
IMMOBILIZER KNEE 22 UNIV (SOFTGOODS) ×1 IMPLANT
KIT BASIN OR (CUSTOM PROCEDURE TRAY) ×3 IMPLANT
KIT ROOM TURNOVER OR (KITS) ×3 IMPLANT
MANIFOLD NEPTUNE II (INSTRUMENTS) ×3 IMPLANT
NEEDLE 22X1 1/2 (OR ONLY) (NEEDLE) ×3 IMPLANT
NS IRRIG 1000ML POUR BTL (IV SOLUTION) ×3 IMPLANT
PACK TOTAL JOINT (CUSTOM PROCEDURE TRAY) ×3 IMPLANT
PAD ARMBOARD 7.5X6 YLW CONV (MISCELLANEOUS) ×3 IMPLANT
PRESSURIZER FEMORAL UNIV (MISCELLANEOUS) IMPLANT
SET HNDPC FAN SPRY TIP SCT (DISPOSABLE) IMPLANT
SLEEVE SURGEON STRL (DRAPES) ×2 IMPLANT
SPONGE LAP 18X18 X RAY DECT (DISPOSABLE) ×2 IMPLANT
STAPLER VISISTAT 35W (STAPLE) ×2 IMPLANT
SUCTION FRAZIER HANDLE 10FR (MISCELLANEOUS) ×2
SUCTION TUBE FRAZIER 10FR DISP (MISCELLANEOUS) ×1 IMPLANT
SUT BONE WAX W31G (SUTURE) IMPLANT
SUT ETHIBOND NAB CT1 #1 30IN (SUTURE) ×19 IMPLANT
SUT MNCRL AB 3-0 PS2 18 (SUTURE) ×3 IMPLANT
SUT VIC AB 0 CT1 27 (SUTURE) ×6
SUT VIC AB 0 CT1 27XBRD ANBCTR (SUTURE) ×2 IMPLANT
SUT VIC AB 1 CT1 27 (SUTURE) ×6
SUT VIC AB 1 CT1 27XBRD ANBCTR (SUTURE) ×2 IMPLANT
SUT VIC AB 2-0 CT1 27 (SUTURE) ×3
SUT VIC AB 2-0 CT1 TAPERPNT 27 (SUTURE) ×1 IMPLANT
SYR CONTROL 10ML LL (SYRINGE) ×3 IMPLANT
TOWEL OR 17X24 6PK STRL BLUE (TOWEL DISPOSABLE) ×3 IMPLANT
TOWEL OR 17X26 10 PK STRL BLUE (TOWEL DISPOSABLE) ×3 IMPLANT
TOWER CARTRIDGE SMART MIX (DISPOSABLE) IMPLANT
TRAY FOLEY CATH SILVER 14FR (SET/KITS/TRAYS/PACK) ×2 IMPLANT
WATER STERILE IRR 1000ML POUR (IV SOLUTION) ×12 IMPLANT
WRAP KNEE MAXI GEL POST OP (GAUZE/BANDAGES/DRESSINGS) ×3 IMPLANT

## 2016-10-04 NOTE — Op Note (Signed)
PATIENT ID:      PURNELL DAIGLE  MRN:     [id number] DOB/AGE:    1955/04/28 / 61 y.o.       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   Weight as of 09/21/16: 168 lb (76.2 kg).     POSTOPERATIVE DIAGNOSIS:   LEFT HIP OSTEOARTHRITIS -SAME                                                                    Estimated body mass index is 25.17 kg/m as calculated from the following:   Height as of 09/21/16: 5' 8.5" (1.74 m).   Weight as of 09/21/16: 168 lb (76.2 kg).     PROCEDURE:  Procedure(s): LEFT TOTAL HIP ARTHROPLASTY     SURGEON:  Joni Fears, MD    ASSISTANT:   Biagio Borg, PA-C   (Present and scrubbed throughout the case, critical for assistance with exposure, retraction, instrumentation, and closure.)          ANESTHESIA: spinal and IV sedation     DRAINS: none :      TOURNIQUET TIME: * No tourniquets in log *    COMPLICATIONS:  None   CONDITION:  stable  PROCEDURE IN DETAIL: [place] 10/04/2016, 9:20 AM

## 2016-10-04 NOTE — Transfer of Care (Signed)
Immediate Anesthesia Transfer of Care Note  Patient: Carl Hunt  Procedure(s) Performed: Procedure(s): LEFT TOTAL HIP ARTHROPLASTY (Left)  Patient Location: PACU  Anesthesia Type:Spinal  Level of Consciousness: awake, alert , oriented and sedated  Airway & Oxygen Therapy: Patient Spontanous Breathing and Patient connected to face mask oxygen  Post-op Assessment: Report given to RN, Post -op Vital signs reviewed and stable, Patient moving all extremities and Patient moving all extremities X 4  Post vital signs: Reviewed and stable  Last Vitals:  Vitals:   10/04/16 0644  BP: (!) 151/75  Pulse: 75  Resp: 18  Temp: 36.8 C    Last Pain:  Vitals:   10/04/16 0644  TempSrc: Oral  PainSc:          Complications: No apparent anesthesia complications

## 2016-10-04 NOTE — Anesthesia Procedure Notes (Signed)
Spinal  Patient location during procedure: OR Start time: 10/04/2016 7:26 AM End time: 10/04/2016 7:28 AM Staffing Anesthesiologist: Suella Broad D Performed: anesthesiologist  Preanesthetic Checklist Completed: patient identified, site marked, surgical consent, pre-op evaluation, timeout performed, IV checked, risks and benefits discussed and monitors and equipment checked Spinal Block Patient position: sitting Prep: Betadine Patient monitoring: heart rate, continuous pulse ox, blood pressure and cardiac monitor Approach: midline Location: L4-5 Injection technique: single-shot Needle Needle type: Introducer and Pencan  Needle gauge: 24 G Needle length: 9 cm Additional Notes Negative paresthesia. Negative blood return. Positive free-flowing CSF. Expiration date of kit checked and confirmed. Patient tolerated procedure well, without complications.

## 2016-10-04 NOTE — Anesthesia Procedure Notes (Signed)
Date/Time: 10/04/2016 7:30 AM Performed by: Izora Gala Pre-anesthesia Checklist: Patient identified, Emergency Drugs available, Suction available and Patient being monitored Patient Re-evaluated:Patient Re-evaluated prior to induction Oxygen Delivery Method: Simple face mask Ventilation: Oral airway inserted - appropriate to patient size Placement Confirmation: positive ETCO2

## 2016-10-04 NOTE — Discharge Instructions (Signed)

## 2016-10-04 NOTE — Op Note (Signed)
NAME:  Carl Hunt, Carl Hunt NO.:  MEDICAL RECORD NO.:  [id number]  LOCATION:                                 FACILITY:  PHYSICIAN:  Vonna Kotyk. Durward Fortes, M.D.    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  PROCEDURE:  Left total hip replacement.  SURGEON:  Vonna Kotyk. Durward Fortes, M.D.  ASSISTANT:  Aaron Edelman D. Petrarca, P.A.-C.  ANESTHESIA:  Spinal with IV sedation.  COMPLICATIONS:  None.  COMPONENTS:  DePuy AML large stature 13.5 mm femoral component, a ceramic 36 mm outer diameter hip ball with a +5 neck length, 54 mm outer diameter Gription 3 metallic acetabular component with an apex hole eliminator and a Marathon polyethylene liner +4.  DESCRIPTION OF PROCEDURE:  Carl Hunt was met in the holding area, identified the left hip was appropriate operative site and marked it accordingly.  Any questions were answered.  The patient was then transported to room #7.  Spinal anesthesia was performed per anesthesia without difficulty.  The patient was then placed supine and the nursing staff inserted a Foley catheter.  Urine was clear.  The patient was then placed in the lateral decubitus position with the left side up and secured to the operating table with the Innomed hip system.  A time-out was called.  The left lower extremity was then prepped from iliac crest to the ankle with chlorhexidine scrub and DuraPrep x2.  Sterile draping was performed.  Time-out was called again.  A routine Southern incision was utilized and via sharp dissection, carried down to subcutaneous tissue.  Gross bleeders were Bovie coagulated.  IT band was identified and incised and then more proximally muscle  fascia was identified and then bluntly dissected.  Raphae in the muscle fibers was identified and separated and self-retaining retractors were inserted.  The short external rotators were identified.  These were detached with a Bovie from the posterior aspect of the greater trochanter and tagged.  It was subsequently sutured back to their original position.  The capsule was identified and incised along its femoral neck and head. There was a small clear yellow joint effusion.  I did palpate the sciatic nerve throughout the procedure.  It was well out of harm's way.  The head was dislocated posteriorly.  The head was amputated about a fingerbreadth proximal to the lesser trochanter.  It was then delivered from the wound.  The head was consistent with avascular necrosis with multiple cysts and a ping-pong effect.  Starter hole was then made in the piriformis fossa as we placed retractors around the femoral neck.  Reaming was performed to 13 mm to accept a 13.5 mm component, nice endosteal purchase.  Rasping was performed sequentially to a 13.5 large stature femoral component.  This fit perfectly on the calcar with nice proximal canal fill.  Retractors were then placed about the acetabulum.  I sharply excised the labrum.  Reaming was performed sequentially to  53 mm to accept a 54 mm component.  I had nice circumferential bleeding.  I trialed a 52 and 54 mm component.  I could completely seat the 52, the 54 had nice rim fit, but would not completely seat. The final Gription 3 metallic acetabular component was then impacted in place using external acetabular guide.  We inserted the trial polyethylene liner followed by the 13.5 mm large stature rasp.  We trailed several neck lengths with a 36 mm outer diameter hip ball.  We felt that the +5 re-established leg lengths and was not too tight.  The trial components were then removed.  I inserted the final apex hole eliminator and the Marathon  polyethylene liner into the acetabulum.  The wound was irrigated throughout the procedure.  I had nice hemostasis.  The 13.5 mm large stature femoral component was then impacted into the femoral canal flush on the calcar.  We again trialed several neck lengths and still felt that the +5 re-established leg lengths and was not too tight.  Because of his age, we felt that the ceramic head was a better component, and accordingly, we applied the 36 mm outer diameter ceramic hip ball with a +5 neck length.  This was impacted in place.  We cleaned the acetabulum and then reduced the hip to full range of motion, and there was no toggling or instability and we thought the leg lengths were now symmetrical.  The wound was irrigated with saline solution.  We injected the capsule with 0.25% Marcaine with epinephrine.  We did apply tranexamic acid topically for nice hemostasis, the hip capsule was closed anatomically with a running #1 Ethibond.  The short external rotators were also closed anatomically with the same material.  Wound was again irrigated. The IT band and gluteus muscle fascia were closed with a running 0 Vicryl, subcu with 2-0 Vicryl and 3-0 Monocryl.  Skin closed with skin clips.  Sterile bulky dressing was applied.  DISPOSITION:  The patient was then placed supine, and then transferred to the operating stretcher into the postanesthesia recovery room without difficulty.     Vonna Kotyk. Durward Fortes, M.D.     PWW/MEDQ  D:  10/04/2016  T:  10/04/2016  Job:  [id number]
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
## 2016-10-04 NOTE — H&P (Signed)
The recent History & Physical has been reviewed. I have personally examined the patient today. There is no interval change to the documented History & Physical. The patient would like to proceed with the procedure.  [name] 10/04/2016,  7:11 AM

## 2016-10-04 NOTE — Anesthesia Postprocedure Evaluation (Signed)
Anesthesia Post Note  Patient: Carl Hunt  Procedure(s) Performed: Procedure(s) (LRB): LEFT TOTAL HIP ARTHROPLASTY (Left)     Patient location during evaluation: PACU Anesthesia Type: Spinal Level of consciousness: oriented and awake and alert Pain management: pain level controlled Vital Signs Assessment: post-procedure vital signs reviewed and stable Respiratory status: spontaneous breathing, respiratory function stable and patient connected to nasal cannula oxygen Cardiovascular status: blood pressure returned to baseline and stable Postop Assessment: no headache, no backache, spinal receding and patient able to bend at knees Anesthetic complications: no    Last Vitals:  Vitals:   10/04/16 1143 10/04/16 1204  BP:    Pulse: 73 71  Resp: 16 15  Temp:      Last Pain:  Vitals:   10/04/16 1204  TempSrc:   PainSc: 3                  Effie Berkshire

## 2016-10-05 ENCOUNTER — Encounter (HOSPITAL_COMMUNITY): Payer: Self-pay | Admitting: Orthopaedic Surgery

## 2016-10-05 LAB — BASIC METABOLIC PANEL
Anion gap: 6 (ref 5–15)
BUN: 18 mg/dL (ref 6–20)
CALCIUM: 8.3 mg/dL — AB (ref 8.9–10.3)
CO2: 25 mmol/L (ref 22–32)
CREATININE: 1.34 mg/dL — AB (ref 0.61–1.24)
Chloride: 100 mmol/L — ABNORMAL LOW (ref 101–111)
GFR calc non Af Amer: 56 mL/min — ABNORMAL LOW (ref >60)
Glucose, Bld: 146 mg/dL — ABNORMAL HIGH (ref 65–99)
Potassium: 5 mmol/L (ref 3.5–5.1)
Sodium: 131 mmol/L — ABNORMAL LOW (ref 135–145)

## 2016-10-05 LAB — CBC
HEMATOCRIT: 29.2 % — AB (ref 39.0–52.0)
Hemoglobin: 9.3 g/dL — ABNORMAL LOW (ref 13.0–17.0)
MCH: 25.5 pg — ABNORMAL LOW (ref 26.0–34.0)
MCHC: 31.8 g/dL (ref 30.0–36.0)
MCV: 80 fL (ref 78.0–100.0)
PLATELETS: 168 10*3/uL (ref 150–400)
RBC: 3.65 MIL/uL — ABNORMAL LOW (ref 4.22–5.81)
RDW: 17 % — AB (ref 11.5–15.5)
WBC: 11.5 10*3/uL — ABNORMAL HIGH (ref 4.0–10.5)

## 2016-10-05 MED ORDER — METHOCARBAMOL 500 MG PO TABS: 500.0000 mg | ORAL_TABLET | Freq: Three times a day (TID) | ORAL | 0 refills | Status: DC | PRN

## 2016-10-05 MED ORDER — RIVAROXABAN 10 MG PO TABS: 10.0000 mg | ORAL_TABLET | Freq: Every day | ORAL | 0 refills | Status: DC
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
## 2016-10-05 NOTE — Care Management Note (Signed)
Case Management Note  Patient Details  Name: Carl Hunt MRN: [id number] Date of Birth: 1955/06/17  Subjective/Objective:    61 yr old gentleman s/p left total hip arthroplasty.                Action/Plan: Case manager spoke with patient concerning discharge plan. Patient says he has all necessary DME. He was preoperatively setup with [facility], no changes. Patient says his sister will be staying with him for a couple weeks during his recovery.     Expected Discharge Date:  10/05/16               Expected Discharge Plan:  [place]  In-House Referral:  NA  Discharge planning Services  CM Consult  Post Acute Care Choice:  Home Health Choice offered to:  Patient  DME Arranged:  N/A (has RW,3in1,wheelchair and shower chair ) DME Agency:  NA  HH Arranged:  PT HH Agency:  [facility] (formerly [employer])  Status of Service:  Completed, signed off  If discussed at [employer] [facility] [employer], dates discussed:    Additional Comments:  Ninfa Meeker, RN 10/05/2016, 11:29 AM

## 2016-10-05 NOTE — Discharge Summary (Signed)
Joni Fears, MD   Biagio Borg, PA-C [address] [place], [place]  [address]                             [phone]  PATIENT ID: Carl Hunt        MRN:  [id number]          DOB/AGE: 1956-01-07 / 61 y.o.    DISCHARGE SUMMARY  ADMISSION DATE:    10/04/2016 DISCHARGE DATE:   10/05/2016   ADMISSION DIAGNOSIS: LEFT HIP OSTEOARTHRITIS    DISCHARGE DIAGNOSIS:  LEFT HIP OSTEOARTHRITIS    ADDITIONAL DIAGNOSIS: Principal Problem:   Avascular necrosis of left femoral head (HCC) Active Problems:   S/P hip replacement, left  Past Medical History:  Diagnosis Date  . Anxiety   . Aortic stenosis    s/p AVR (21 mm Edwards pericardial tissues) 06/23/16  . Arthritis   . CAD (coronary artery disease)   . Cancer ([place])    SKIN CA  . Depression   . Heart murmur     PROCEDURE: Procedure(s): LEFT TOTAL HIP ARTHROPLASTY Left on 10/04/2016  CONSULTS: none    HISTORY: Carl Hunt a 61 y.o.maleWho has a history of pain and functional disability in the lefthip(s) due to trauma and arthritisand patient has failed non-surgical conservative treatments for greater than 12 weeks to include NSAID's and/or analgesics, use of assistive devices, weight reduction as appropriate and activity modification. Onset of symptoms was gradualstarting 6years ago with rapidlly worseningcourse since that time.The patient noted no past surgeryon the lefthip(s). Patient currently rates pain in the lefthip at 9out of 10 with activity. Patient has night pain, worsening of pain with activity and weight bearing, trendelenberg gait, pain that interfers with activities of daily living, pain with passive range of motion and crepitus. Patient has evidenceofsubchondral cysts, subchondral sclerosis, periarticular osteophytes and joint space narrowingby imaging studies. This condition presents safety issues increasing the risk of falls.There is no current active infection  HOSPITAL COURSE:   Carl Hunt is a 61 y.o. admitted on 10/04/2016 and found to have a diagnosis of LEFT HIP OSTEOARTHRITIS.  After appropriate laboratory studies were obtained  they were taken to the operating room on 10/04/2016 and underwent  Procedure(s): LEFT TOTAL HIP ARTHROPLASTY  Left.   They were given perioperative antibiotics:  Anti-infectives    Start     Dose/Rate Route Frequency Ordered Stop   10/04/16 1300  ceFAZolin (ANCEF) IVPB 1 g/50 mL premix     1 g 100 mL/hr over 30 Minutes Intravenous Every 6 hours 10/04/16 1251 10/04/16 1902   10/04/16 0610  ceFAZolin (ANCEF) IVPB 2g/100 mL premix     2 g 200 mL/hr over 30 Minutes Intravenous On call to O.R. 10/04/16 8338 10/04/16 0720    .  Tolerated the procedure well.  Placed with a foley intraoperatively.    Toradol was given post op.  POD #1, allowed out of bed to a chair.  PT for ambulation and exercise program.  Foley D/C'd in morning.  IV saline locked.  O2 discontionued. He had minimal pain.  Was up on his own voiding without difficulty.  Desired discharge to home after PT  The remainder of the hospital course was dedicated to ambulation and strengthening.   The patient was discharged on 1 Day Post-Op in  Stable condition.  Blood products given:none  DIAGNOSTIC STUDIES: Recent vital signs: Patient Vitals for the past 24 hrs:  BP Temp Temp src Pulse Resp SpO2  10/04/16 2052 125/71 (!) 97.5 F (36.4 C) Oral 68 18 96 %  10/04/16 1500 136/73 (!) 97.4 F (36.3 C) Oral 73 18 93 %  10/04/16 1400 - - - - - 97 %  10/04/16 1233 129/77 - - 70 20 97 %  10/04/16 1230 - (!) 97.5 F (36.4 C) - 74 18 97 %  10/04/16 1204 - - - 71 15 96 %  10/04/16 1143 - - - 73 16 96 %  10/04/16 1142 - - - 65 19 96 %  10/04/16 1120 115/76 - - 72 13 97 %  10/04/16 1115 - - - 78 17 99 %  10/04/16 1104 124/74 - - - - -  10/04/16 1100 - - - (!) 55 10 98 %  10/04/16 1049 114/68 - - - - -  10/04/16 1041 - - - 64 12 99 %  10/04/16 1034 112/67 - - - - -  10/04/16  1026 119/72 - - 73 (!) 9 100 %  10/04/16 1000 - - - 74 15 98 %  10/04/16 0947 140/64 (!) 97.5 F (36.4 C) - 72 (!) 22 100 %       Recent laboratory studies:  Recent Labs  10/05/16 0354  WBC 11.5*  HGB 9.3*  HCT 29.2*  PLT 168    Recent Labs  10/04/16 0631 10/05/16 0354  NA 132* 131*  K 4.5 5.0  CL 100* 100*  CO2 26 25  BUN 13 18  CREATININE 1.24 1.34*  GLUCOSE 108* 146*  CALCIUM 8.9 8.3*   Lab Results  Component Value Date   INR 1.10 09/21/2016   INR 1.38 06/23/2016   INR 1.50 06/23/2016     Recent Radiographic Studies :  Dg Chest 2 View  Result Date: 09/21/2016 CLINICAL DATA:  Hip surgery. EXAM: CHEST  2 VIEW COMPARISON:  07/29/2016 . FINDINGS: Mediastinum hilar structures are normal. Prior cardiac valve replacement. Heart size normal. No focal infiltrate. No pleural effusion or pneumothorax. Stable lower thoracic vertebral body compression fractures. IMPRESSION: Prior cardiac valve replacement.  No acute cardiopulmonary disease . Electronically Signed   By: Marcello Moores  Register   On: 09/21/2016 16:13   Dg Hip Port Unilat With Pelvis 1v Left  Result Date: 10/04/2016 CLINICAL DATA:  Status post left hip joint prosthesis placement. EXAM: DG HIP (WITH OR WITHOUT PELVIS) 1V PORT LEFT COMPARISON:  None in PACs FINDINGS: AP and cross-table lateral views of the left hip reveal the placement of a joint prosthesis. Radiographic positioning of the prosthetic components is good. The interface with the native bone appears normal. There surgical skin staples present. IMPRESSION: No immediate complication following left hip joint prosthesis placement. Electronically Signed   By: David  Martinique M.D.   On: 10/04/2016 10:31    DISCHARGE INSTRUCTIONS: Discharge Instructions    Call MD / Call 911    Complete by:  As directed    If you experience chest pain or shortness of breath, CALL 911 and be transported to the hospital emergency room.  If you develope a fever above 101 F, pus (white  drainage) or increased drainage or redness at the wound, or calf pain, call your surgeon's office.   Change dressing    Complete by:  As directed    DO NOT CHANGE THE DRESSING   Constipation Prevention    Complete by:  As directed    Drink plenty of fluids.  Prune juice may be helpful.  You  may use a stool softener, such as Colace (over the counter) 100 mg twice a day.  Use MiraLax (over the counter) for constipation as needed.   Diet general    Complete by:  As directed    Discharge instructions    Complete by:  As directed    [place] items at home which could result in a fall. This includes throw rugs or furniture in walking pathways ICE to the affected joint every three hours while awake for 30 minutes at a time, for at least the first 3-5 days, and then as needed for pain and swelling.  Continue to use ice for pain and swelling. You may notice swelling that will progress down to the foot and ankle.  This is normal after surgery.  Elevate your leg when you are not up walking on it.   Continue to use the breathing machine you got in the hospital (incentive spirometer) which will help keep your temperature down.  It is common for your temperature to cycle up and down following surgery, especially at night when you are not up moving around and exerting yourself.  The breathing machine keeps your lungs expanded and your temperature down.   DIET:  As you were doing prior to hospitalization, we recommend a well-balanced diet.  DRESSING / WOUND CARE / SHOWERING  Keep the surgical dressing until follow up.  The dressing is water proof, so you can shower without any extra covering.  IF THE DRESSING FALLS OFF or the wound gets wet inside, change the dressing with sterile gauze.  Please use good hand washing techniques before changing the dressing.  Do not use any lotions or creams on the incision until instructed by your surgeon.    ACTIVITY  Increase activity  slowly as tolerated, but follow the weight bearing instructions below.   No driving for 6 weeks or until further direction given by your physician.  You cannot drive while taking narcotics.  No lifting or carrying greater than 10 lbs. until further directed by your surgeon. Avoid periods of inactivity such as sitting longer than an hour when not asleep. This helps prevent blood clots.  You may return to work once you are authorized by your doctor.     WEIGHT BEARING   Weight bearing as tolerated with assist device (walker, cane, etc) as directed, use it as long as suggested by your surgeon or therapist, typically at least 4-6 weeks.   EXERCISES  Results after joint replacement surgery are often greatly improved when you follow the exercise, range of motion and muscle strengthening exercises prescribed by your doctor. Safety measures are also important to protect the joint from further injury. Any time any of these exercises cause you to have increased pain or swelling, decrease what you are doing until you are comfortable again and then slowly increase them. If you have problems or questions, call your caregiver or physical therapist for advice.   Rehabilitation is important following a joint replacement. After just a few days of immobilization, the muscles of the leg can become weakened and shrink (atrophy).  These exercises are designed to build up the tone and strength of the thigh and leg muscles and to improve motion. Often times heat used for twenty to thirty minutes before working out will loosen up your tissues and help with improving the range of motion but do not use heat for the first two weeks following surgery (sometimes heat can increase post-operative swelling).  These exercises can be done on a training (exercise) mat, on a table or on a bed. Use whatever works the best and is most comfortable for you.    Use music or television while you are exercising so that the exercises are a  pleasant break in your day. This will make your life better with the exercises acting as a break in your routine that you can look forward to.   Perform all exercises about fifteen times, three times per day or as directed.  You should exercise both the operative leg and the other leg as well.   Exercises include:  Quad Sets - Tighten up the muscle on the front of the thigh (Quad) and hold for 5-10 seconds.   Straight Leg Raises - With your knee straight (if you were given a brace, keep it on), lift the leg to 60 degrees, hold for 3 seconds, and slowly lower the leg.  Perform this exercise against resistance later as your leg gets stronger.  Leg Slides: Lying on your back, slowly slide your foot toward your buttocks, bending your knee up off the floor (only go as far as is comfortable). Then slowly slide your foot back down until your leg is flat on the floor again.  Angel Wings: Lying on your back spread your legs to the side as far apart as you can without causing discomfort.  Hamstring Strength:  Lying on your back, push your heel against the floor with your leg straight by tightening up the muscles of your buttocks.  Repeat, but this time bend your knee to a comfortable angle, and push your heel against the floor.  You may put a pillow under the heel to make it more comfortable if necessary.   A rehabilitation program following joint replacement surgery can speed recovery and prevent re-injury in the future due to weakened muscles. Contact your doctor or a physical therapist for more information on knee rehabilitation.    CONSTIPATION  Constipation is defined medically as fewer than three stools per week and severe constipation as less than one stool per week.  Even if you have a regular bowel pattern at home, your normal regimen is likely to be disrupted due to multiple reasons following surgery.  Combination of anesthesia, postoperative narcotics, change in appetite and fluid intake all can  affect your bowels.   YOU MUST use at least one of the following options; they are listed in order of increasing strength to get the job done.  They are all available over the counter, and you may need to use some, POSSIBLY even all of these options:    Drink plenty of fluids (prune juice may be helpful) and high fiber foods Colace 100 mg by mouth twice a day  Senokot for constipation as directed and as needed Dulcolax (bisacodyl), take with full glass of water  Miralax (polyethylene glycol) once or twice a day as needed.  If you have tried all these things and are unable to have a bowel movement in the first 3-4 days after surgery call either your surgeon or your primary doctor.    If you experience loose stools or diarrhea, hold the medications until you stool forms back up.  If your symptoms do not get better within 1 week or if they get worse, check with your doctor.  If you experience "the worst abdominal pain ever" or develop nausea or vomiting, please contact the office immediately for further recommendations for treatment.   ITCHING:  If  you experience itching with your medications, try taking only a single pain pill, or even half a pain pill at a time.  You can also use Benadryl over the counter for itching or also to help with sleep.   TED HOSE STOCKINGS:  Use stockings on both legs until for at least 2 weeks or as directed by physician office. They may be removed at night for sleeping.  MEDICATIONS:  See your medication summary on the "After Visit Summary" that nursing will review with you.  You may have some home medications which will be placed on hold until you complete the course of blood thinner medication.  It is important for you to complete the blood thinner medication as prescribed.  PRECAUTIONS:  If you experience chest pain or shortness of breath - call 911 immediately for transfer to the hospital emergency department.   If you develop a fever greater that 101 F, purulent  drainage from wound, increased redness or drainage from wound, foul odor from the wound/dressing, or calf pain - CONTACT YOUR SURGEON.                                                   FOLLOW-UP APPOINTMENTS:  If you do not already have a post-op appointment, please call the office for an appointment to be seen by your surgeon.  Guidelines for how soon to be seen are listed in your "After Visit Summary", but are typically between 1-4 weeks after surgery.  OTHER INSTRUCTIONS:   Knee Replacement:  Do not place pillow under knee, focus on keeping the knee straight while resting. CPM instructions: 0-90 degrees, 2 hours in the morning, 2 hours in the afternoon, and 2 hours in the evening. Place foam block, curve side up under heel at all times except when in CPM or when walking.  DO NOT modify, tear, cut, or change the foam block in any way.  MAKE SURE YOU:  Understand these instructions.  Get help right away if you are not doing well or get worse.    Thank you for letting us be a part of your medical care team.  It is a privilege we respect greatly.  We hope these instructions will help you stay on track for a fast and full recovery!   Driving restrictions    Complete by:  As directed    No driving for 6 weeks   Follow the hip precautions as taught in Physical Therapy    Complete by:  As directed    Increase activity slowly as tolerated    Complete by:  As directed    Lifting restrictions    Complete by:  As directed    No lifting for 6 weeks   Patient may shower    Complete by:  As directed    You may shower over the brown dressing   TED hose    Complete by:  As directed    Use stockings (TED hose) for 2 weeks on left leg.  You may remove them at night for sleeping.   Weight bearing as tolerated    Complete by:  As directed    Laterality:  left   Extremity:  Lower      DISCHARGE MEDICATIONS:   Allergies as of 10/05/2016      Reactions   No Known Allergies  Adhesive [tape] Rash        Medication List    STOP taking these medications   amLODipine 5 MG tablet Commonly known as:  NORVASC   aspirin 325 MG EC tablet   Fish Oil 1200 MG Caps   furosemide 40 MG tablet Commonly known as:  LASIX   ibuprofen 200 MG tablet Commonly known as:  ADVIL,MOTRIN   MUSCLE RUB EX     TAKE these medications   alprazolam 2 MG tablet Commonly known as:  XANAX Take 0.25 mg by mouth 4 (four) times daily.   HYDROcodone-acetaminophen 10-325 MG tablet Commonly known as:  NORCO Take 1 tablet by mouth every 6 (six) hours as needed for moderate pain or severe pain. What changed:  when to take this   methocarbamol 500 MG tablet Commonly known as:  ROBAXIN Take 1 tablet (500 mg total) by mouth every 8 (eight) hours as needed for muscle spasms.   rivaroxaban 10 MG Tabs tablet Commonly known as:  XARELTO Take 1 tablet (10 mg total) by mouth daily with breakfast.   rosuvastatin 20 MG tablet Commonly known as:  CRESTOR Take 20 mg by mouth daily with supper.   sertraline 50 MG tablet Commonly known as:  ZOLOFT Take 50 mg by mouth at bedtime.            Durable Medical Equipment        Start     Ordered   10/04/16 1252  DME Walker rolling  Once    Question:  Patient needs a walker to treat with the following condition  Answer:  S/P total hip arthroplasty   10/04/16 1251   10/04/16 1252  DME 3 n 1  Once     10/04/16 1251   10/04/16 1252  DME Bedside commode  Once    Question:  Patient needs a bedside commode to treat with the following condition  Answer:  S/P total hip arthroplasty   10/04/16 1251      FOLLOW UP VISIT:   Follow-up Information    [name], MD. Schedule an appointment as soon as possible for a visit on 10/19/2016.   Specialty:  Orthopedic Surgery Contact information: [address] [place] [address] [phone]           DISPOSITION:   Home  CONDITION:  Stable   Mike Craze. [place], [place] [phone]  10/05/2016 8:09 AM

## 2016-10-05 NOTE — Plan of Care (Signed)
Problem: Education: Goal: Knowledge of [facility] Education information/materials will improve Outcome: Progressing POC and pain management reviewed with pt.   

## 2016-10-05 NOTE — Progress Notes (Signed)
PATIENT ID: Carl Hunt        MRN:  [id number]          DOB/AGE: 04-01-1955 / 61 y.o.    Joni Fears, MD   Biagio Borg, PA-C [address] [place], [place]  [address]                             [phone]   PROGRESS NOTE  Subjective:  negative for Chest Pain  negative for Shortness of Breath  negative for Nausea/Vomiting   negative for Calf Pain    Tolerating Diet: yes         Patient reports pain as mild.     Comfortable night with minimal pain and less than pre op  Objective: Vital signs in last 24 hours:   Patient Vitals for the past 24 hrs:  BP Temp Temp src Pulse Resp SpO2  10/04/16 2052 125/71 (!) 97.5 F (36.4 C) Oral 68 18 96 %  10/04/16 1500 136/73 (!) 97.4 F (36.3 C) Oral 73 18 93 %  10/04/16 1400 - - - - - 97 %  10/04/16 1233 129/77 - - 70 20 97 %  10/04/16 1230 - (!) 97.5 F (36.4 C) - 74 18 97 %  10/04/16 1204 - - - 71 15 96 %  10/04/16 1143 - - - 73 16 96 %  10/04/16 1142 - - - 65 19 96 %  10/04/16 1120 115/76 - - 72 13 97 %  10/04/16 1115 - - - 78 17 99 %  10/04/16 1104 124/74 - - - - -  10/04/16 1100 - - - (!) 55 10 98 %  10/04/16 1049 114/68 - - - - -  10/04/16 1041 - - - 64 12 99 %  10/04/16 1034 112/67 - - - - -  10/04/16 1026 119/72 - - 73 (!) 9 100 %  10/04/16 1000 - - - 74 15 98 %  10/04/16 0947 140/64 (!) 97.5 F (36.4 C) - 72 (!) 22 100 %      Intake/Output from previous day:   07/31 0701 - 08/01 0700 In: 3170 [P.O.:720; I.V.:2000] Out: 1650 [Urine:1300]   Intake/Output this shift:   No intake/output data recorded.   Intake/Output      07/31 0701 - 08/01 0700 08/01 0701 - 08/02 0700   P.O. 720    I.V. 2000    Other 300    IV Piggyback 150    Total Intake 3170     Urine 1300    Blood 350    Total Output 1650     Net +1520             LABORATORY DATA:  Recent Labs  10/05/16 0354  WBC 11.5*  HGB 9.3*  HCT 29.2*  PLT 168    Recent Labs  10/04/16 0631 10/05/16 0354  NA 132* 131*  K 4.5 5.0    CL 100* 100*  CO2 26 25  BUN 13 18  CREATININE 1.24 1.34*  GLUCOSE 108* 146*  CALCIUM 8.9 8.3*   Lab Results  Component Value Date   INR 1.10 09/21/2016   INR 1.38 06/23/2016   INR 1.50 06/23/2016    Recent Radiographic Studies :  Dg Chest 2 View  Result Date: 09/21/2016 CLINICAL DATA:  Hip surgery. EXAM: CHEST  2 VIEW COMPARISON:  07/29/2016 . FINDINGS: Mediastinum hilar structures are normal. Prior cardiac valve replacement. Heart size  normal. No focal infiltrate. No pleural effusion or pneumothorax. Stable lower thoracic vertebral body compression fractures. IMPRESSION: Prior cardiac valve replacement.  No acute cardiopulmonary disease . Electronically Signed   By: Marcello Moores  Register   On: 09/21/2016 16:13   Dg Hip Port Unilat With Pelvis 1v Left  Result Date: 10/04/2016 CLINICAL DATA:  Status post left hip joint prosthesis placement. EXAM: DG HIP (WITH OR WITHOUT PELVIS) 1V PORT LEFT COMPARISON:  None in PACs FINDINGS: AP and cross-table lateral views of the left hip reveal the placement of a joint prosthesis. Radiographic positioning of the prosthetic components is good. The interface with the native bone appears normal. There surgical skin staples present. IMPRESSION: No immediate complication following left hip joint prosthesis placement. Electronically Signed   By: David  Martinique M.D.   On: 10/04/2016 10:31     Examination:  General appearance: alert, cooperative and no distress  Wound Exam: clean, dry, intact   Drainage:  None: wound tissue dry  Motor Exam: EHL, FHL, Anterior Tibial and Posterior Tibial Intact  Sensory Exam: Superficial Peroneal, Deep Peroneal and Tibial normal  Vascular Exam: Normal  Assessment:    1 Day Post-Op  Procedure(s) (LRB): LEFT TOTAL HIP ARTHROPLASTY (Left)  ADDITIONAL DIAGNOSIS:  Principal Problem:   Avascular necrosis of left femoral head (HCC) Active Problems:   S/P hip replacement, left  Acute Blood Loss  Anemia-asymptomatic   Plan: Physical Therapy as ordered Weight Bearing as Tolerated (WBAT)  DVT Prophylaxis:  Xarelto, Foot Pumps and TED hose  DISCHARGE PLAN: Home  DISCHARGE NEEDS: HHPT, Walker and 3-in-1 comode seat Saline lock IV, OOB with PT-possible discharge this pm.Able to sleep without problem ,good effort in PT yesterday       [name]  10/05/2016 7:56 AM  Patient ID: Carl Hunt, male   DOB: 04-08-1955, 61 y.o.   MRN: [id number]

## 2016-10-07 ENCOUNTER — Telehealth (INDEPENDENT_AMBULATORY_CARE_PROVIDER_SITE_OTHER): Payer: Self-pay | Admitting: Orthopaedic Surgery

## 2016-10-07 NOTE — Telephone Encounter (Signed)
Patient calling to see if there is another rx he can have for a blood thinner. Patient states Zerelto is going to be 450 and patient cannot afford that. Patient uses [facility] fax# [phone].

## 2016-10-07 NOTE — Telephone Encounter (Signed)
Please advise 

## 2016-10-07 NOTE — Telephone Encounter (Signed)
Called re taking 1 ASA twice daily

## 2016-10-10 ENCOUNTER — Other Ambulatory Visit (INDEPENDENT_AMBULATORY_CARE_PROVIDER_SITE_OTHER): Payer: Self-pay | Admitting: Orthopaedic Surgery

## 2016-10-12 ENCOUNTER — Inpatient Hospital Stay (INDEPENDENT_AMBULATORY_CARE_PROVIDER_SITE_OTHER): Payer: Medicare Other | Admitting: Orthopaedic Surgery

## 2016-10-19 ENCOUNTER — Ambulatory Visit (INDEPENDENT_AMBULATORY_CARE_PROVIDER_SITE_OTHER): Payer: Medicare Other | Admitting: Orthopaedic Surgery

## 2016-10-19 ENCOUNTER — Ambulatory Visit (INDEPENDENT_AMBULATORY_CARE_PROVIDER_SITE_OTHER): Payer: Medicare Other

## 2016-10-19 DIAGNOSIS — Z96642 Presence of left artificial hip joint: Secondary | ICD-10-CM

## 2016-10-19 NOTE — Progress Notes (Signed)
Office Visit Note   Patient: Carl Hunt           Date of Birth: Jan 07, 1956           MRN: [id number] Visit Date: 10/19/2016              Requested by: No referring provider defined for this encounter. PCP: System, Pcp Not In   Assessment & Plan: Visit Diagnoses:  1. Presence of left artificial hip joint   2. Status post left hip replacement     Plan: 2 weeks status post primary left total hip replacement doing quite well. Only is on a "occasional" pain medicine. Independent with a Significantly better than he was preoperatively. Continue home exercises. Continue with aspirin twice a day and return in 1 month. Need to evaluate left knee at some point in the future.   Follow-Up Instructions: Return in about 1 month (around 11/19/2016).   Orders:  Orders Placed This Encounter  Procedures  . XR HIP UNILAT W OR W/O PELVIS 1V LEFT   No orders of the defined types were placed in this encounter.     Procedures: No procedures performed   Clinical Data: No additional findings.   Subjective: Chief Complaint  Patient presents with  . Left Hip - Routine Post Op    Carl Hunt is a 61 y o that Left Hip replacement. He relates he has his pain meds with his pain management doctor and his dentist giving you antibiotics. He ambulates with a cane for balance.   Denies any fever or chills shortness of breath or chest pain. Giardia preoperative pain has resolved.  HPI  Review of Systems  Constitutional: Negative for fatigue.  HENT: Negative for hearing loss.   Respiratory: Negative for apnea, chest tightness and shortness of breath.   Cardiovascular: Negative for chest pain, palpitations and leg swelling.  Gastrointestinal: Negative for blood in stool, constipation and diarrhea.  Genitourinary: Negative for difficulty urinating.  Musculoskeletal: Negative for arthralgias, back pain, joint swelling, myalgias, neck pain and neck stiffness.  Neurological: Negative for  weakness, numbness and headaches.  Hematological: Does not bruise/bleed easily.  Psychiatric/Behavioral: Negative for sleep disturbance. The patient is not nervous/anxious.      Objective: Vital Signs: There were no vitals taken for this visit.  Physical Exam  Ortho Exam neurovascular exam intact to left lower extremity. No edema. Leg lengths appeared to be equal. Left hip incision is healing without evidence of infection. Steri-Strips were applied after clips removed.  Specialty Comments:  No specialty comments available.  Imaging: No results found.   PMFS History: Patient Active Problem List   Diagnosis Date Noted  . Avascular necrosis of left femoral head ([place]) 10/04/2016  . S/P hip replacement, left 10/04/2016  . S/P AVR (aortic valve replacement) 06/23/2016  . Aortic stenosis, severe 06/02/2016  . Osteoarthritis of left hip 06/02/2016   Past Medical History:  Diagnosis Date  . Anxiety   . Aortic stenosis    s/p AVR (21 mm Edwards pericardial tissues) 06/23/16  . Arthritis   . CAD (coronary artery disease)   . Cancer ([place])    SKIN CA  . Depression   . Heart murmur     No family history on file.  Past Surgical History:  Procedure Laterality Date  . AORTIC VALVE REPLACEMENT N/A 06/23/2016   Procedure: AORTIC VALVE REPLACEMENT (AVR);  Surgeon: Ivin Poot, MD;  Location: [facility];  Service: Open Heart Surgery;  Laterality: N/A;  .  CARDIAC CATHETERIZATION  05/25/2016   Dr Sabra Heck  . CARDIOVASCULAR STRESS TEST     04/16/15 (Dr. Orpah Greek): NL LV contractility, EF 51%, no sign reversible ischemia, low risk scan  . KNEE ARTHROSCOPY     RIGHT  . LEG SURGERY     RIGHT LEG   . TEE WITHOUT CARDIOVERSION N/A 06/23/2016   Procedure: TRANSESOPHAGEAL ECHOCARDIOGRAM (TEE);  Surgeon: Ivin Poot, MD;  Location: [facility];  Service: Open Heart Surgery;  Laterality: N/A;  . TOTAL HIP ARTHROPLASTY Left 10/04/2016   Procedure: LEFT TOTAL HIP ARTHROPLASTY;  Surgeon: [name], MD;  Location: [facility];  Service: Orthopedics;  Laterality: Left;   Social History   Occupational History  . Not on file.   Social History Main Topics  . Smoking status: Former Smoker    Packs/day: 0.50    Years: 35.00    Types: Cigarettes    Quit date: 05/05/2016  . Smokeless tobacco: Never Used     Comment: March 2018 stopped smoking  . Alcohol use 0.6 oz/week    1 Cans of beer per week     Comment: occasional  . Drug use: No  . Sexual activity: Not on file

## 2016-11-23 ENCOUNTER — Ambulatory Visit (INDEPENDENT_AMBULATORY_CARE_PROVIDER_SITE_OTHER): Payer: Medicare Other | Admitting: Orthopaedic Surgery

## 2016-11-23 ENCOUNTER — Encounter (INDEPENDENT_AMBULATORY_CARE_PROVIDER_SITE_OTHER): Payer: Self-pay | Admitting: Orthopaedic Surgery

## 2016-11-23 ENCOUNTER — Ambulatory Visit (INDEPENDENT_AMBULATORY_CARE_PROVIDER_SITE_OTHER): Payer: Medicare Other

## 2016-11-23 VITALS — BP 110/70 | HR 70 | Resp 12 | Ht 68.0 in | Wt 165.0 lb

## 2016-11-23 DIAGNOSIS — G8929 Other chronic pain: Secondary | ICD-10-CM

## 2016-11-23 DIAGNOSIS — M25562 Pain in left knee: Secondary | ICD-10-CM

## 2016-11-23 DIAGNOSIS — Z96642 Presence of left artificial hip joint: Secondary | ICD-10-CM

## 2016-11-23 MED ORDER — LIDOCAINE HCL 1 % IJ SOLN
5.0000 mL | INTRAMUSCULAR | Status: AC | PRN
  Administered 2016-11-23: 5 mL

## 2016-11-23 MED ORDER — METHYLPREDNISOLONE ACETATE 40 MG/ML IJ SUSP
80.0000 mg | INTRAMUSCULAR | Status: AC | PRN
  Administered 2016-11-23: 80 mg

## 2016-11-23 MED ORDER — BUPIVACAINE HCL 0.5 % IJ SOLN
3.0000 mL | INTRAMUSCULAR | Status: AC | PRN
  Administered 2016-11-23: 3 mL via INTRA_ARTICULAR

## 2016-11-23 NOTE — Progress Notes (Signed)
Office Visit Note   Patient: Carl Hunt           Date of Birth: [birth year]           MRN: [id number] Visit Date: 11/23/2016              Requested by: No referring provider defined for this encounter. PCP: System, Pcp Not In   Assessment & Plan: Visit Diagnoses:  1. Chronic pain of left knee   2. Status post left hip replacement   doing well with left hip replacement. Mild osteoarthritis left knee by x-ray.  Plan: aspirate left knee and injected cortisone. No fluid aspirated. Knee injected with cortisone with immediate relief of pain. Also prescribed Lasix for a short course as this worked in the past. He had a similar problem with his leg after his cardiac surgerythat responded to Lasix.onsider MRI scan left knee if no improvement  Follow-Up Instructions: No Follow-up on file.   Orders:  Orders Placed This Encounter  Procedures  . XR KNEE 3 VIEW LEFT   No orders of the defined types were placed in this encounter.     Procedures: Large Joint Inj Date/Time: 11/23/2016 10:45 AM Performed by: [name] Authorized by: [name]   Consent Given by:  Patient Timeout: prior to procedure the correct patient, procedure, and site was verified   Indications:  Pain and joint swelling Location:  Knee Site:  L knee Prep: patient was prepped and draped in usual sterile fashion   Needle Size:  25 G Needle Length:  1.5 inches Approach:  Anteromedial Ultrasound Guidance: No   Fluoroscopic Guidance: No   Arthrogram: No   Medications:  5 mL lidocaine 1 %; 80 mg methylPREDNISolone acetate 40 MG/ML; 3 mL bupivacaine 0.5 % Aspiration Attempted: Yes   Aspirate amount (mL):  0 Patient tolerance:  Patient tolerated the procedure well with no immediate complications     Clinical Data: No additional findings.   Subjective: Chief Complaint  Patient presents with  . Left Hip - Routine Post Op    Carl Hunt is a 61 y o status post 7.5 weeks L THA on  10/04/16. He relates his Left knee hurts, hot red and swollen, limited ROM really bad for 1 week.  has been experiencing increasing left knee pain for the past several weeks. Had some pre-existing knee pain prior to his left hip surgery. Denies any fever or chills, no shortness of breath or chest pain. No injury or trauma. Very happy with left hip. Not using any ambulatory aid.  HPI  Review of Systems  Constitutional: Negative for fatigue.  HENT: Negative for hearing loss.   Respiratory: Negative for apnea, chest tightness and shortness of breath.   Cardiovascular: Negative for chest pain, palpitations and leg swelling.  Gastrointestinal: Negative for blood in stool, constipation and diarrhea.  Genitourinary: Negative for difficulty urinating.  Musculoskeletal: Positive for back pain. Negative for arthralgias, joint swelling, myalgias, neck pain and neck stiffness.  Neurological: Negative for weakness, numbness and headaches.  Hematological: Does not bruise/bleed easily.  Psychiatric/Behavioral: Negative for sleep disturbance. The patient is not nervous/anxious.      Objective: Vital Signs: BP 110/70   Pulse 70   Resp 12   Ht 5\' 8"  (1.727 m)   Wt 165 lb (74.8 kg)   BMI 25.09 kg/m   Physical Exam  Ortho Examawake alert and oriented 3 comfortable sitting. Painless range of motion of left hip. Left hip incision healed  without any complications. Positive induration of left thigh and left calf. Similar appearance of r calf. No anelling. Neurovascular exam intact. Positive effusion left kneewith medial and lateral joint pain. No instability.  Specialty Comments:  No specialty comments available.  Imaging: No results found.   PMFS History: Patient Active Problem List   Diagnosis Date Noted  . Avascular necrosis of left femoral head ([place]) 10/04/2016  . S/P hip replacement, left 10/04/2016  . S/P AVR (aortic valve replacement) 06/23/2016  . Aortic stenosis, severe 06/02/2016  .  Osteoarthritis of left hip 06/02/2016   Past Medical History:  Diagnosis Date  . Anxiety   . Aortic stenosis    s/p AVR (21 mm Edwards pericardial tissues) 06/23/16  . Arthritis   . CAD (coronary artery disease)   . Cancer ([place])    SKIN CA  . Depression   . Heart murmur     History reviewed. No pertinent family history.  Past Surgical History:  Procedure Laterality Date  . AORTIC VALVE REPLACEMENT N/A 06/23/2016   Procedure: AORTIC VALVE REPLACEMENT (AVR);  Surgeon: Ivin Poot, MD;  Location: [facility];  Service: Open Heart Surgery;  Laterality: N/A;  . CARDIAC CATHETERIZATION  05/25/2016   Dr Sabra Heck  . CARDIOVASCULAR STRESS TEST     04/16/15 (Dr. Orpah Greek): NL LV contractility, EF 51%, no sign reversible ischemia, low risk scan  . KNEE ARTHROSCOPY     RIGHT  . LEG SURGERY     RIGHT LEG   . TEE WITHOUT CARDIOVERSION N/A 06/23/2016   Procedure: TRANSESOPHAGEAL ECHOCARDIOGRAM (TEE);  Surgeon: Ivin Poot, MD;  Location: [facility];  Service: Open Heart Surgery;  Laterality: N/A;  . TOTAL HIP ARTHROPLASTY Left 10/04/2016   Procedure: LEFT TOTAL HIP ARTHROPLASTY;  Surgeon: [name], MD;  Location: [facility];  Service: Orthopedics;  Laterality: Left;   Social History   Occupational History  . Not on file.   Social History Main Topics  . Smoking status: Former Smoker    Packs/day: 0.50    Years: 35.00    Types: Cigarettes    Quit date: 05/05/2016  . Smokeless tobacco: Never Used     Comment: March 2018 stopped smoking  . Alcohol use 0.6 oz/week    1 Cans of beer per week     Comment: occasional  . Drug use: No  . Sexual activity: Not on file

## 2016-12-21 ENCOUNTER — Inpatient Hospital Stay (INDEPENDENT_AMBULATORY_CARE_PROVIDER_SITE_OTHER): Payer: Medicare Other | Admitting: Orthopaedic Surgery

## 2017-11-03 ENCOUNTER — Telehealth (INDEPENDENT_AMBULATORY_CARE_PROVIDER_SITE_OTHER): Payer: Self-pay | Admitting: Orthopaedic Surgery

## 2017-11-03 ENCOUNTER — Telehealth: Payer: Self-pay

## 2017-11-03 NOTE — Telephone Encounter (Signed)
Patient called regarding a release note to have work done since his surgery in 2018.  He stated he was having dental work done, and his dentist needed a letter.  I contacted the patient and left a voicemail on his phone stating he would need to get the note from his Cardiologist and would also need an antibiotic as well before he had any dental work done because he had a valve replacement.  Will await a return call if warranted.

## 2017-11-03 NOTE — Telephone Encounter (Signed)
Patient needs a note to state he does not need antibiotics anymore before dental work/ surgery since he is 1 year post THR. Please fax to # [phone] Dr. Haywood Pao.

## 2017-11-07 NOTE — Telephone Encounter (Signed)
Please advise 

## 2017-11-13 NOTE — Telephone Encounter (Signed)
Needs to take antibioyics for 2 years after hi[p replacement for any procedures

## 2017-11-13 NOTE — Telephone Encounter (Signed)
Would prefer 2 years of antibiotics s/p THR

## 2017-11-13 NOTE — Telephone Encounter (Signed)
LEFT MESSAGE WAITING FOR PT TO CALL BACK

## 2017-11-13 NOTE — Telephone Encounter (Signed)
Please call patient and advise. Thank you.

## 2017-11-13 NOTE — Telephone Encounter (Signed)
PLEASE ADVISE.

## 2017-11-14 ENCOUNTER — Other Ambulatory Visit (INDEPENDENT_AMBULATORY_CARE_PROVIDER_SITE_OTHER): Payer: Self-pay | Admitting: Radiology

## 2017-11-14 ENCOUNTER — Telehealth (INDEPENDENT_AMBULATORY_CARE_PROVIDER_SITE_OTHER): Payer: Self-pay | Admitting: Orthopaedic Surgery

## 2017-11-14 ENCOUNTER — Encounter (INDEPENDENT_AMBULATORY_CARE_PROVIDER_SITE_OTHER): Payer: Self-pay | Admitting: Radiology

## 2017-11-14 MED ORDER — AMOXICILLIN 500 MG PO CAPS: ORAL_CAPSULE | ORAL | 0 refills | Status: DC

## 2017-11-14 NOTE — Telephone Encounter (Signed)
SENT IN ANTIBIOTICS TO [facility]. CAN I SEND LETTER TO DR. Haywood Pao? PLEASE ADVISE

## 2017-11-14 NOTE — Telephone Encounter (Signed)
FAXED LETTER TO [phone]

## 2017-11-14 NOTE — Telephone Encounter (Signed)
Patient called stating he will be having dental work done (cavity filled and then an extraction) in November with Dr. Haywood Pao at [facility].   Patient states he needs a letter faxed to Dr. Haywood Pao stating that he can have dental procedures.  Fax [phone]   Patient states he understands he needs to take antibiotics before the procedure and requested that they be sent to [facility] in [place].  Their phone [phone]

## 2017-11-14 NOTE — Telephone Encounter (Signed)
Ok to send letter

## 2017-11-17 ENCOUNTER — Telehealth (INDEPENDENT_AMBULATORY_CARE_PROVIDER_SITE_OTHER): Payer: Self-pay | Admitting: Orthopaedic Surgery

## 2017-11-17 ENCOUNTER — Other Ambulatory Visit: Payer: Self-pay | Admitting: *Deleted

## 2017-11-17 ENCOUNTER — Other Ambulatory Visit (INDEPENDENT_AMBULATORY_CARE_PROVIDER_SITE_OTHER): Payer: Self-pay | Admitting: Radiology

## 2017-11-17 MED ORDER — AMOXICILLIN 500 MG PO CAPS: ORAL_CAPSULE | ORAL | 0 refills | Status: DC

## 2017-11-17 NOTE — Telephone Encounter (Signed)
Notified pt meds called into [place]

## 2017-11-17 NOTE — Telephone Encounter (Signed)
Patient called stating his prescription of Amoxicillin was sent to the wrong pharmacy.  Patient states he needs the prescription to be sent to [facility] at [address] in [place], [place].

## 2018-01-04 ENCOUNTER — Telehealth (INDEPENDENT_AMBULATORY_CARE_PROVIDER_SITE_OTHER): Payer: Self-pay | Admitting: Orthopaedic Surgery

## 2018-01-04 NOTE — Telephone Encounter (Signed)
Ok for letter

## 2018-01-04 NOTE — Telephone Encounter (Signed)
Patient left a voicemail stating he is having dental work in November and requested a release form faxed to Dr. Cherylynn Ridges office at [phone]

## 2018-01-04 NOTE — Telephone Encounter (Signed)
MAY I SEND IN LETTER TO RELEASE PT TO HAVE DENTAL WORK DONE. PT HAS ANTIBIOTICS WE CALLED IN BUT OFFICE NEEDS A RELEASE LETTER

## 2018-01-05 ENCOUNTER — Encounter (INDEPENDENT_AMBULATORY_CARE_PROVIDER_SITE_OTHER): Payer: Self-pay | Admitting: Radiology

## 2018-01-05 NOTE — Telephone Encounter (Signed)
FAXED LETTER

## 2018-05-02 ENCOUNTER — Other Ambulatory Visit (INDEPENDENT_AMBULATORY_CARE_PROVIDER_SITE_OTHER): Payer: Self-pay | Admitting: Orthopaedic Surgery

## 2018-05-02 NOTE — Telephone Encounter (Signed)
Ok to prescribe

## 2018-05-04 ENCOUNTER — Other Ambulatory Visit (INDEPENDENT_AMBULATORY_CARE_PROVIDER_SITE_OTHER): Payer: Self-pay | Admitting: Orthopaedic Surgery

## 2018-05-04 MED ORDER — AMOXICILLIN 500 MG PO CAPS: ORAL_CAPSULE | ORAL | 0 refills | Status: DC

## 2018-05-08 NOTE — Telephone Encounter (Signed)
ok 

## 2018-08-01 IMAGING — CT CT CHEST W/O CM
3 of 4 series · 16 of 30 positions shown, 18 images · non-contrast
Comparison: 04/20/2016 chest radiograph.

CLINICAL DATA: Preoperative for aortic valve replacement. Thoracic
aortic aneurysm. Quit smoking 1 month prior.

EXAM:
CT CHEST WITHOUT CONTRAST
TECHNIQUE: Multidetector CT imaging of the chest was performed following the
standard protocol without IV contrast.

[Series 3: chest w/o · axial · non-contrast · 0.70mm/px · z∈[-283,-36]mm · 7 of 133 slices shown, 9 images]
[im 17/133  mediastinal]
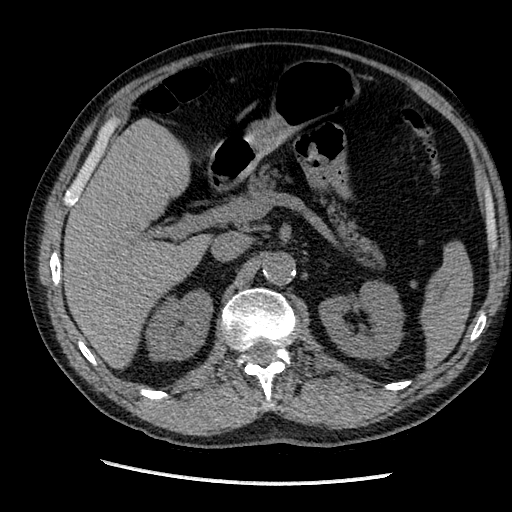
[im 17/133  lung]
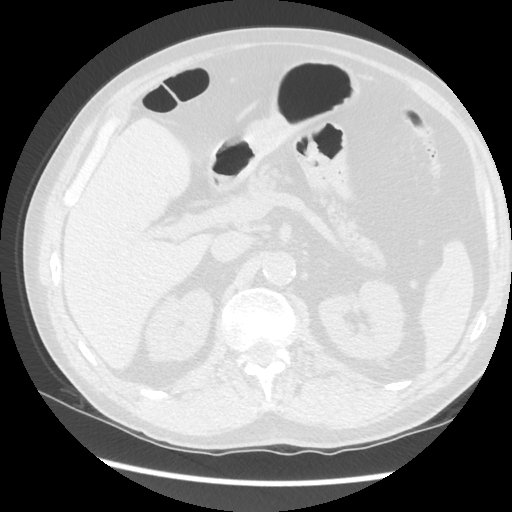
[im 34/133  lung]
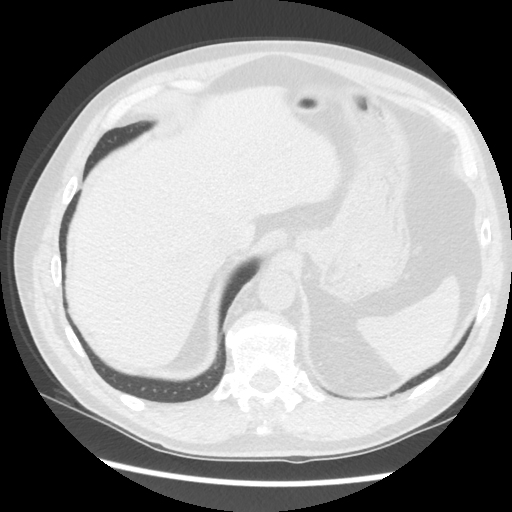
[im 50/133  lung]
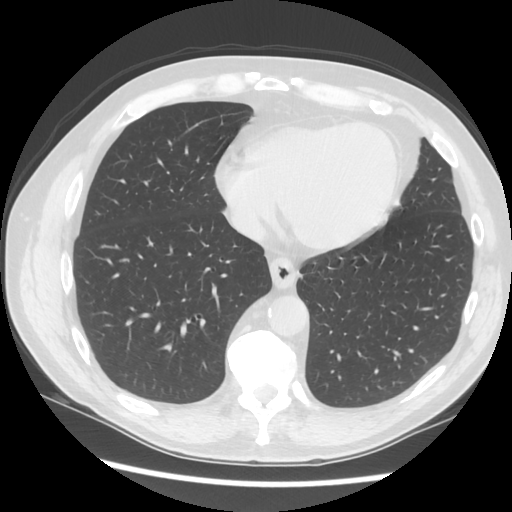
[im 67/133  lung]
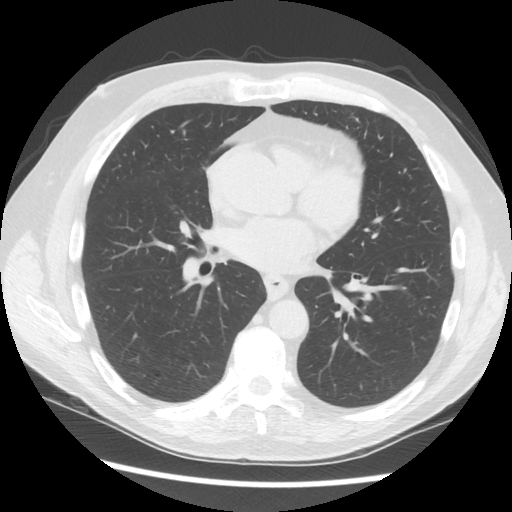
[im 83/133  mediastinal]
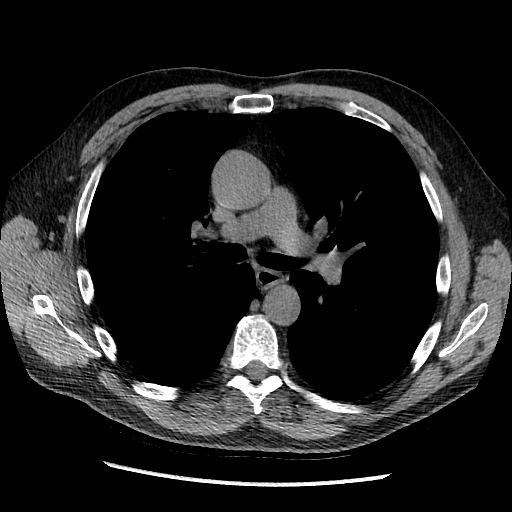
[im 83/133  lung]
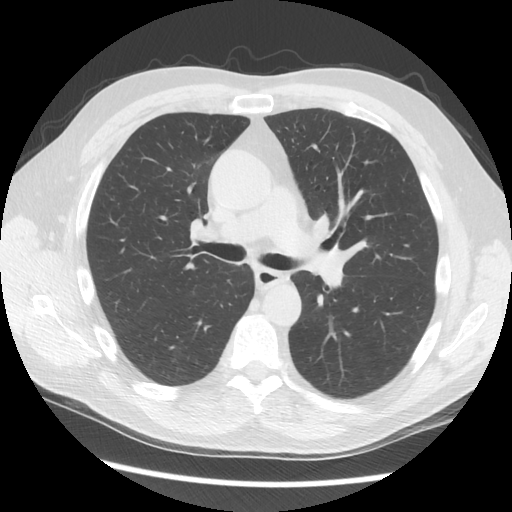
[im 100/133  lung]
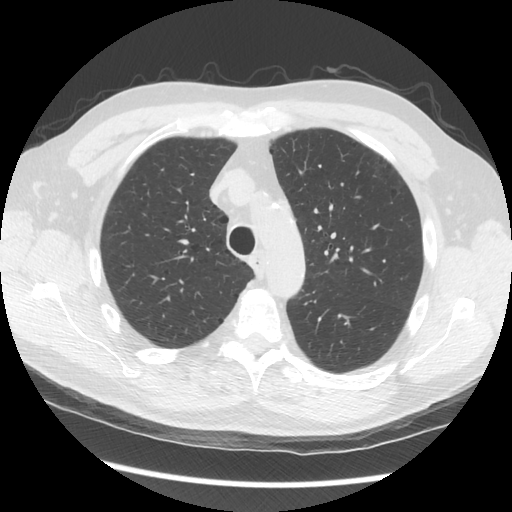
[im 116/133  lung]
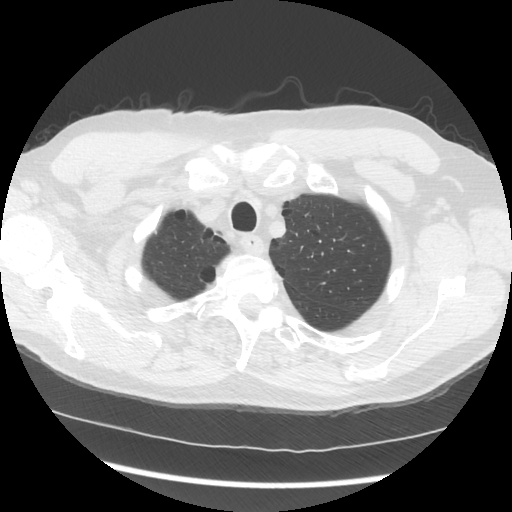

[Series 4: lung windows · axial · 0.70mm/px · z∈[-283,-36]mm · 7 of 133 slices shown]
[im 17/133  lung]
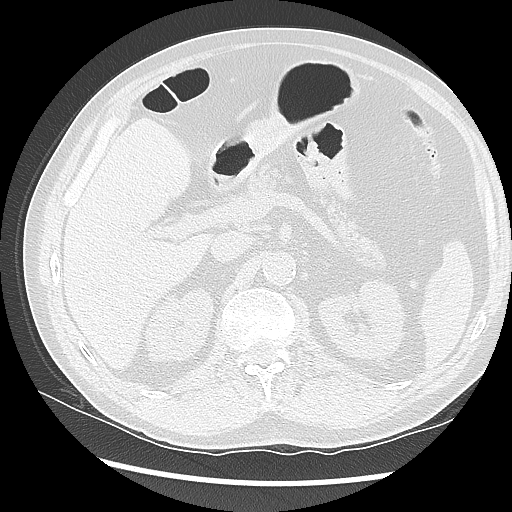
[im 34/133  lung]
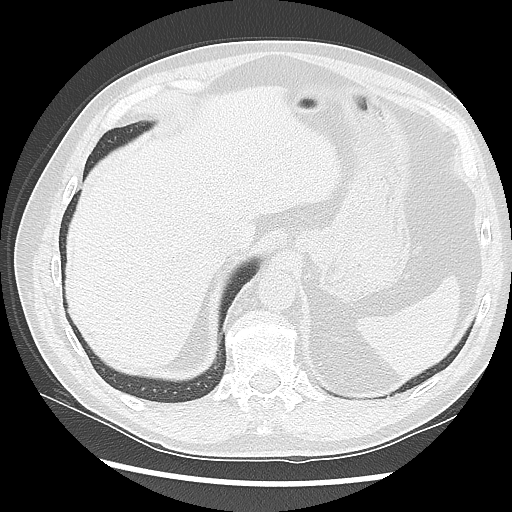
[im 50/133  lung]
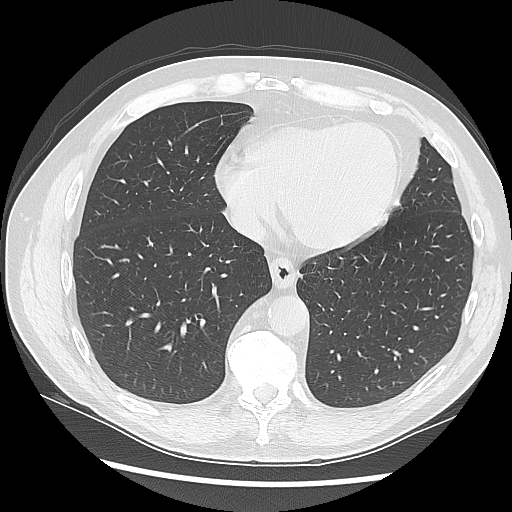
[im 67/133  lung]
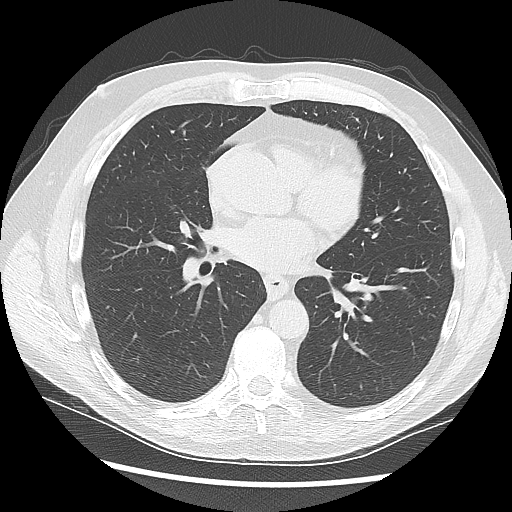
[im 83/133  lung]
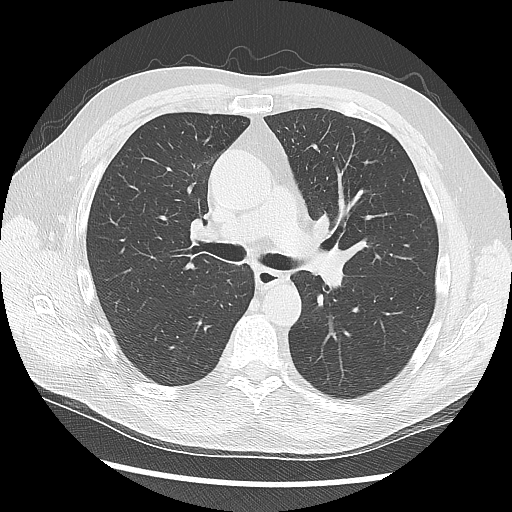
[im 100/133  lung]
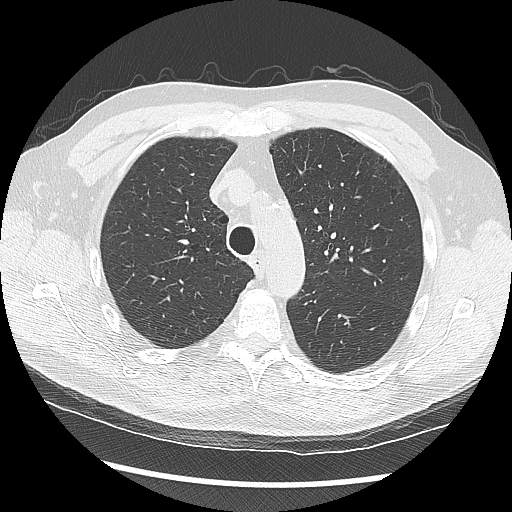
[im 116/133  lung]
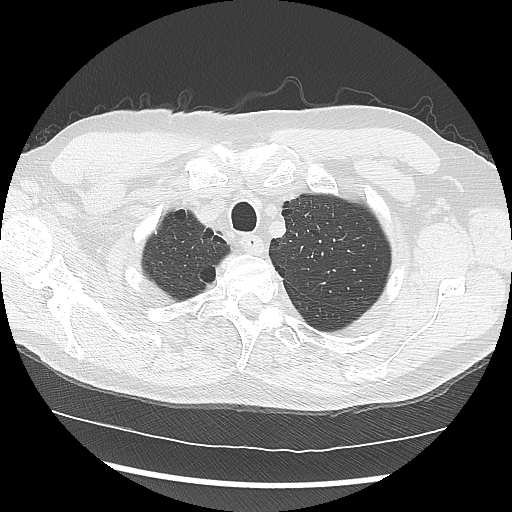

[Series 602: sagittal body · sagittal · 0.70mm/px · 2 of 145 slices shown]
[im 17/145  mediastinal]
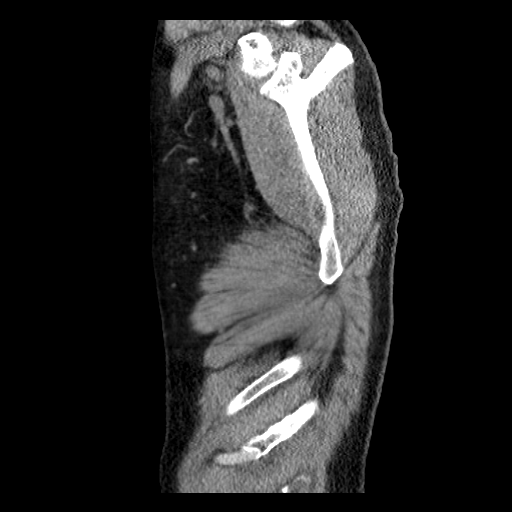
[im 33/145  mediastinal]
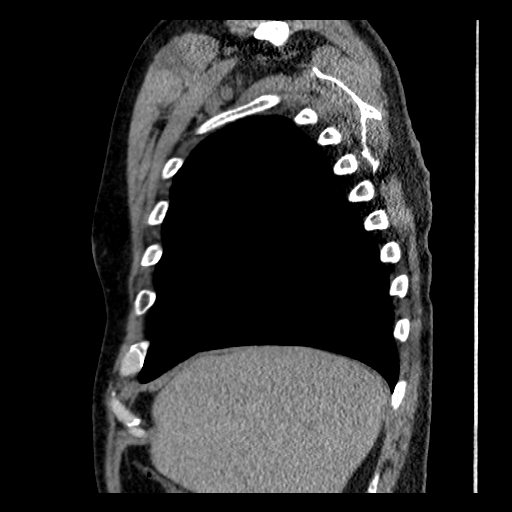

[16 of 30 positions shown; findings below may reference images not displayed]

FINDINGS: Cardiovascular: Normal heart size. No significant pericardial
fluid/thickening. Left anterior descending and left circumflex
coronary atherosclerosis. Coarse aortic valvular calcifications.
Aortic atherosclerosis. Ascending aortic aneurysm with maximum
diameter 4.3 cm. Normal caliber pulmonary arteries.

Mediastinum/Nodes: No discrete thyroid nodules. Unremarkable
esophagus. No pathologically enlarged axillary, mediastinal or gross
hilar lymph nodes, noting limited sensitivity for the detection of
hilar adenopathy on this noncontrast study.

Lungs/Pleura: No pneumothorax. No pleural effusion. Mild
centrilobular and paraseptal emphysema with mild diffuse bronchial
wall thickening. Solid right upper lobe 3 mm pulmonary nodule
(series 4/ image 48). Punctate calcified granuloma in the peripheral
left upper lobe. No acute consolidative airspace disease, lung
masses or additional significant pulmonary nodules.

Upper abdomen: Suggestion of ectasia of the infrarenal abdominal
aorta on the lower most image, incompletely evaluated on this scan.
Subcentimeter hypodense lateral segment left liver lobe lesion, too
small to characterize, for which no further follow-up is required
unless the patient has risk factors for liver malignancy.

Musculoskeletal: No aggressive appearing focal osseous lesions.
Mild-to-moderate T11 and mild T12 anterior compression fractures,
which are stable since 04/20/2016 and appear chronic. Mild thoracic
spondylosis.
IMPRESSION: 1. Aortic valvular calcifications, correlating with the provided
history of aortic stenosis.
2. Aortic atherosclerosis. Ascending thoracic aortic aneurysm,
maximum diameter 4.3 cm. Recommend annual imaging followup by CTA or
MRA. This recommendation follows 0565
ACCF/AHA/AATS/ACR/ASA/SCA/WIMBERLY/AUJLA/DERNIER/GALLANT Guidelines for the
Diagnosis and Management of Patients with Thoracic Aortic Disease.
Circulation. 0565; 121: [id number].
3. Suggestion of ectasia of the infrarenal abdominal aorta,
incompletely evaluated on this scan. Consider correlation with
screening abdominal aortic ultrasound or CT angiogram of the abdomen
and pelvis.
4. Two vessel coronary atherosclerosis.
5. Mild emphysema with mild diffuse bronchial wall thickening,
suggesting COPD .
6. Solitary 3 mm solid right upper lobe pulmonary nodule. No
follow-up needed if patient is low-risk. Non-contrast chest CT can
be considered in 12 months if patient is high-risk. This
recommendation follows the consensus statement: Guidelines for
Management of Incidental Pulmonary Nodules Detected on CT Images:

## 2018-08-31 ENCOUNTER — Other Ambulatory Visit (INDEPENDENT_AMBULATORY_CARE_PROVIDER_SITE_OTHER): Payer: Self-pay | Admitting: Orthopaedic Surgery

## 2018-08-31 NOTE — Telephone Encounter (Signed)
Please advise 

## 2018-09-02 NOTE — Telephone Encounter (Signed)
Ok for amoxicillin as outlined

## 2018-09-02 NOTE — Telephone Encounter (Signed)
Ok for amoxicillin

## 2018-09-04 NOTE — Telephone Encounter (Signed)
Please handle. Thank you.
